# Patient Record
Sex: Female | Born: 1985 | Race: Black or African American | Hispanic: No | Marital: Single | Smoking: Current every day smoker
Health system: Southern US, Community
[De-identification: ages and names within clinical notes are randomized; demographics above are authoritative.]

## PROBLEM LIST (undated history)

## (undated) DIAGNOSIS — E119 Type 2 diabetes mellitus without complications: Secondary | ICD-10-CM

---
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
## 2005-01-03 ENCOUNTER — Ambulatory Visit (HOSPITAL_COMMUNITY): Admission: AD | Admit: 2005-01-03 | Discharge: 2005-01-04 | Payer: Self-pay | Admitting: Obstetrics and Gynecology

## 2005-01-04 ENCOUNTER — Inpatient Hospital Stay (HOSPITAL_COMMUNITY): Admission: AD | Admit: 2005-01-04 | Discharge: 2005-01-07 | Payer: Self-pay | Admitting: Obstetrics and Gynecology

## 2008-10-03 ENCOUNTER — Other Ambulatory Visit: Admission: RE | Admit: 2008-10-03 | Discharge: 2008-10-03 | Payer: Self-pay | Admitting: Obstetrics and Gynecology

## 2008-11-14 ENCOUNTER — Inpatient Hospital Stay (HOSPITAL_COMMUNITY): Admission: AD | Admit: 2008-11-14 | Discharge: 2008-11-20 | Payer: Self-pay | Admitting: Obstetrics and Gynecology

## 2008-11-14 ENCOUNTER — Ambulatory Visit (HOSPITAL_COMMUNITY): Admission: RE | Admit: 2008-11-14 | Discharge: 2008-11-14 | Payer: Self-pay | Admitting: Obstetrics and Gynecology

## 2008-11-16 ENCOUNTER — Encounter: Payer: Self-pay | Admitting: Otolaryngology

## 2009-04-20 ENCOUNTER — Inpatient Hospital Stay (HOSPITAL_COMMUNITY): Admission: AD | Admit: 2009-04-20 | Discharge: 2009-04-23 | Payer: Self-pay | Admitting: Obstetrics and Gynecology

## 2009-04-20 ENCOUNTER — Ambulatory Visit: Payer: Self-pay | Admitting: Obstetrics & Gynecology

## 2009-09-29 ENCOUNTER — Emergency Department (HOSPITAL_COMMUNITY): Admission: EM | Admit: 2009-09-29 | Discharge: 2009-09-29 | Payer: Self-pay | Admitting: Emergency Medicine

## 2009-11-28 ENCOUNTER — Emergency Department (HOSPITAL_COMMUNITY): Admission: EM | Admit: 2009-11-28 | Discharge: 2009-11-28 | Payer: Self-pay | Admitting: Emergency Medicine

## 2010-03-25 ENCOUNTER — Emergency Department (HOSPITAL_COMMUNITY): Admission: EM | Admit: 2010-03-25 | Discharge: 2010-03-25 | Payer: Self-pay | Admitting: Emergency Medicine

## 2010-11-16 ENCOUNTER — Encounter: Payer: Self-pay | Admitting: Family Medicine

## 2011-01-14 LAB — POCT I-STAT, CHEM 8
BUN: 3 mg/dL — ABNORMAL LOW (ref 6–23)
Calcium, Ion: 1.15 mmol/L (ref 1.12–1.32)
Chloride: 107 mEq/L (ref 96–112)
Creatinine, Ser: 0.6 mg/dL (ref 0.4–1.2)
Glucose, Bld: 98 mg/dL (ref 70–99)
HCT: 41 % (ref 36.0–46.0)
Hemoglobin: 13.9 g/dL (ref 12.0–15.0)
Potassium: 4.1 mEq/L (ref 3.5–5.1)
Sodium: 141 mEq/L (ref 135–145)
TCO2: 25 mmol/L (ref 0–100)

## 2011-01-14 LAB — PREGNANCY, URINE: Preg Test, Ur: NEGATIVE

## 2011-01-14 LAB — GLUCOSE, CAPILLARY: Glucose-Capillary: 91 mg/dL (ref 70–99)

## 2011-02-02 LAB — CBC
HCT: 33.7 % — ABNORMAL LOW (ref 36.0–46.0)
HCT: 39.2 % (ref 36.0–46.0)
Hemoglobin: 11.6 g/dL — ABNORMAL LOW (ref 12.0–15.0)
Hemoglobin: 13.4 g/dL (ref 12.0–15.0)
MCHC: 34.3 g/dL (ref 30.0–36.0)
MCHC: 34.5 g/dL (ref 30.0–36.0)
MCV: 86.3 fL (ref 78.0–100.0)
MCV: 86.8 fL (ref 78.0–100.0)
Platelets: 302 10*3/uL (ref 150–400)
Platelets: 374 10*3/uL (ref 150–400)
RBC: 3.88 MIL/uL (ref 3.87–5.11)
RBC: 4.54 MIL/uL (ref 3.87–5.11)
RDW: 13.9 % (ref 11.5–15.5)
RDW: 14.1 % (ref 11.5–15.5)
WBC: 13.1 10*3/uL — ABNORMAL HIGH (ref 4.0–10.5)
WBC: 15.6 10*3/uL — ABNORMAL HIGH (ref 4.0–10.5)

## 2011-02-02 LAB — RPR: RPR Ser Ql: NONREACTIVE

## 2011-02-09 LAB — DIFFERENTIAL
Basophils Absolute: 0.1 10*3/uL (ref 0.0–0.1)
Basophils Absolute: 0 10*3/uL (ref 0.0–0.1)
Basophils Absolute: 0 10*3/uL (ref 0.0–0.1)
Basophils Absolute: 0 10*3/uL (ref 0.0–0.1)
Basophils Absolute: 0 10*3/uL (ref 0.0–0.1)
Basophils Absolute: 0.1 10*3/uL (ref 0.0–0.1)
Basophils Relative: 0 % (ref 0–1)
Basophils Relative: 0 % (ref 0–1)
Basophils Relative: 0 % (ref 0–1)
Basophils Relative: 0 % (ref 0–1)
Basophils Relative: 0 % (ref 0–1)
Basophils Relative: 1 % (ref 0–1)
Eosinophils Absolute: 0.1 10*3/uL (ref 0.0–0.7)
Eosinophils Absolute: 0.1 10*3/uL (ref 0.0–0.7)
Eosinophils Absolute: 0.3 10*3/uL (ref 0.0–0.7)
Eosinophils Absolute: 0.3 10*3/uL (ref 0.0–0.7)
Eosinophils Absolute: 0.4 10*3/uL (ref 0.0–0.7)
Eosinophils Absolute: 0.4 10*3/uL (ref 0.0–0.7)
Eosinophils Relative: 0 % (ref 0–5)
Eosinophils Relative: 1 % (ref 0–5)
Eosinophils Relative: 2 % (ref 0–5)
Eosinophils Relative: 3 % (ref 0–5)
Eosinophils Relative: 3 % (ref 0–5)
Eosinophils Relative: 3 % (ref 0–5)
Lymphocytes Relative: 10 % — ABNORMAL LOW (ref 12–46)
Lymphocytes Relative: 25 % (ref 12–46)
Lymphocytes Relative: 27 % (ref 12–46)
Lymphocytes Relative: 27 % (ref 12–46)
Lymphocytes Relative: 29 % (ref 12–46)
Lymphocytes Relative: 31 % (ref 12–46)
Lymphs Abs: 2.3 10*3/uL (ref 0.7–4.0)
Lymphs Abs: 2.9 10*3/uL (ref 0.7–4.0)
Lymphs Abs: 3 10*3/uL (ref 0.7–4.0)
Lymphs Abs: 3.9 10*3/uL (ref 0.7–4.0)
Lymphs Abs: 3.9 10*3/uL (ref 0.7–4.0)
Lymphs Abs: 4 10*3/uL (ref 0.7–4.0)
Monocytes Absolute: 1.4 10*3/uL — ABNORMAL HIGH (ref 0.1–1.0)
Monocytes Absolute: 0.8 10*3/uL (ref 0.1–1.0)
Monocytes Absolute: 0.9 10*3/uL (ref 0.1–1.0)
Monocytes Absolute: 0.9 10*3/uL (ref 0.1–1.0)
Monocytes Absolute: 1 10*3/uL (ref 0.1–1.0)
Monocytes Absolute: 1.1 10*3/uL — ABNORMAL HIGH (ref 0.1–1.0)
Monocytes Relative: 7 % (ref 3–12)
Monocytes Relative: 7 % (ref 3–12)
Monocytes Relative: 7 % (ref 3–12)
Monocytes Relative: 7 % (ref 3–12)
Monocytes Relative: 7 % (ref 3–12)
Monocytes Relative: 8 % (ref 3–12)
Neutro Abs: 18 10*3/uL — ABNORMAL HIGH (ref 1.7–7.7)
Neutro Abs: 6.8 10*3/uL (ref 1.7–7.7)
Neutro Abs: 7.1 10*3/uL (ref 1.7–7.7)
Neutro Abs: 8 10*3/uL — ABNORMAL HIGH (ref 1.7–7.7)
Neutro Abs: 8.4 10*3/uL — ABNORMAL HIGH (ref 1.7–7.7)
Neutro Abs: 9.5 10*3/uL — ABNORMAL HIGH (ref 1.7–7.7)
Neutrophils Relative %: 83 % — ABNORMAL HIGH (ref 43–77)
Neutrophils Relative %: 58 % (ref 43–77)
Neutrophils Relative %: 61 % (ref 43–77)
Neutrophils Relative %: 63 % (ref 43–77)
Neutrophils Relative %: 64 % (ref 43–77)
Neutrophils Relative %: 65 % (ref 43–77)

## 2011-02-09 LAB — CBC
HCT: 36.7 % (ref 36.0–46.0)
HCT: 32.9 % — ABNORMAL LOW (ref 36.0–46.0)
HCT: 33.3 % — ABNORMAL LOW (ref 36.0–46.0)
HCT: 33.5 % — ABNORMAL LOW (ref 36.0–46.0)
HCT: 34.3 % — ABNORMAL LOW (ref 36.0–46.0)
HCT: 35.3 % — ABNORMAL LOW (ref 36.0–46.0)
Hemoglobin: 12.4 g/dL (ref 12.0–15.0)
Hemoglobin: 11 g/dL — ABNORMAL LOW (ref 12.0–15.0)
Hemoglobin: 11.2 g/dL — ABNORMAL LOW (ref 12.0–15.0)
Hemoglobin: 11.5 g/dL — ABNORMAL LOW (ref 12.0–15.0)
Hemoglobin: 11.6 g/dL — ABNORMAL LOW (ref 12.0–15.0)
Hemoglobin: 11.7 g/dL — ABNORMAL LOW (ref 12.0–15.0)
MCHC: 33.8 g/dL (ref 30.0–36.0)
MCHC: 32.8 g/dL (ref 30.0–36.0)
MCHC: 33.5 g/dL (ref 30.0–36.0)
MCHC: 33.6 g/dL (ref 30.0–36.0)
MCHC: 34.1 g/dL (ref 30.0–36.0)
MCHC: 34.3 g/dL (ref 30.0–36.0)
MCV: 86.1 fL (ref 78.0–100.0)
MCV: 85 fL (ref 78.0–100.0)
MCV: 85.6 fL (ref 78.0–100.0)
MCV: 86.3 fL (ref 78.0–100.0)
MCV: 86.5 fL (ref 78.0–100.0)
MCV: 87 fL (ref 78.0–100.0)
Platelets: 400 10*3/uL (ref 150–400)
Platelets: 373 10*3/uL (ref 150–400)
Platelets: 388 10*3/uL (ref 150–400)
Platelets: 411 10*3/uL — ABNORMAL HIGH (ref 150–400)
Platelets: 418 10*3/uL — ABNORMAL HIGH (ref 150–400)
Platelets: 425 10*3/uL — ABNORMAL HIGH (ref 150–400)
RBC: 4.26 MIL/uL (ref 3.87–5.11)
RBC: 3.81 MIL/uL — ABNORMAL LOW (ref 3.87–5.11)
RBC: 3.85 MIL/uL — ABNORMAL LOW (ref 3.87–5.11)
RBC: 3.94 MIL/uL (ref 3.87–5.11)
RBC: 4 MIL/uL (ref 3.87–5.11)
RBC: 4.06 MIL/uL (ref 3.87–5.11)
RDW: 13.2 % (ref 11.5–15.5)
RDW: 12.9 % (ref 11.5–15.5)
RDW: 13 % (ref 11.5–15.5)
RDW: 13 % (ref 11.5–15.5)
RDW: 13.3 % (ref 11.5–15.5)
RDW: 13.4 % (ref 11.5–15.5)
WBC: 21.8 10*3/uL — ABNORMAL HIGH (ref 4.0–10.5)
WBC: 10.9 10*3/uL — ABNORMAL HIGH (ref 4.0–10.5)
WBC: 12.2 10*3/uL — ABNORMAL HIGH (ref 4.0–10.5)
WBC: 12.3 10*3/uL — ABNORMAL HIGH (ref 4.0–10.5)
WBC: 13.7 10*3/uL — ABNORMAL HIGH (ref 4.0–10.5)
WBC: 14.9 10*3/uL — ABNORMAL HIGH (ref 4.0–10.5)

## 2011-02-09 LAB — BASIC METABOLIC PANEL
BUN: 2 mg/dL — ABNORMAL LOW (ref 6–23)
BUN: 2 mg/dL — ABNORMAL LOW (ref 6–23)
CO2: 23 mEq/L (ref 19–32)
CO2: 23 mEq/L (ref 19–32)
Calcium: 8.1 mg/dL — ABNORMAL LOW (ref 8.4–10.5)
Calcium: 8.6 mg/dL (ref 8.4–10.5)
Chloride: 101 mEq/L (ref 96–112)
Chloride: 102 mEq/L (ref 96–112)
Creatinine, Ser: 0.4 mg/dL (ref 0.4–1.2)
Creatinine, Ser: 0.42 mg/dL (ref 0.4–1.2)
GFR calc Af Amer: >60 mL/min (ref >60)
GFR calc Af Amer: >60 mL/min (ref >60)
GFR calc non Af Amer: >60 mL/min (ref >60)
GFR calc non Af Amer: >60 mL/min (ref >60)
Glucose, Bld: 90 mg/dL (ref 70–99)
Glucose, Bld: 94 mg/dL (ref 70–99)
Potassium: 3.4 mEq/L — ABNORMAL LOW (ref 3.5–5.1)
Potassium: 3.8 mEq/L (ref 3.5–5.1)
Sodium: 131 mEq/L — ABNORMAL LOW (ref 135–145)
Sodium: 133 mEq/L — ABNORMAL LOW (ref 135–145)

## 2011-02-09 LAB — EYE CULTURE: Culture: NO GROWTH

## 2011-03-10 NOTE — H&P (Signed)
Michaela Sanders, Michaela Sanders NO.:  [id number]   MEDICAL RECORD NO.:  [id number]          PATIENT TYPE:  INP   LOCATION:  5148                         FACILITY:  [facility]   PHYSICIAN:  Antony Contras, MD     DATE OF BIRTH:  Dec 18, 1985   DATE OF ADMISSION:  11/15/2008  DATE OF DISCHARGE:                              HISTORY & PHYSICAL   CHIEF COMPLAINT:  Facial swelling.   HISTORY OF PRESENT ILLNESS:  The patient is a 25 year old female who is  59 weeks' pregnant who developed sinus and nasal congestion several days  ago with headache and bloody nasal drainage.  She was treated as an  outpatient with a Z-Pak and decongestant.  Yesterday morning, she  developed left upper and lower eyelid edema and went to see her OB/GYN.  A CT scan was performed and she was admitted to the hospital and placed  on intravenous antibiotics, Afrin, an antibiotic eyedrops.  Swelling and  pain has worsened, however.  Swelling has spread down into her cheek as  well.  Pain is now 5/10 after some pain medication.  She has had no  previous significant sinus disease.   PAST MEDICAL HISTORY:  Pregnancy.   PAST SURGICAL HISTORY:  Ear tubes and adenotonsillectomy.   MEDICATIONS:  Z-Pak and Zofran.   ALLERGIES:  PENICILLIN from childhood.   FAMILY HISTORY:  Diabetes.   SOCIAL HISTORY:  The patient has been a smoker, but quit 2 months ago.  She drinks alcohol occasionally though does not drink during pregnancy.  She is unemployed.  She lives with her 80-year-old child.   REVIEW OF SYSTEMS:  Negative except as listed above.   PHYSICAL EXAMINATION:  VITAL SIGNS:  Afebrile.  Vital signs stable.  GENERAL:  The patient is in no acute distress and is pleasant and  cooperative.  EARS:  External external ears are normal.  External canals are clear.  Tympanic membranes are intact but have some scarring.  Middle ear spaces  are aerated.  NOSE:  External nose is normal.  There is a piercing of the nose.   The  nasal passages are congested.  OROPHARYNX:  There is a tongue piercing.  The lips, teeth, and gums are  normal.  The oropharynx is normal.  Floor of mouth and buccal mucosa are  normal.  EYES:  The left upper and lower eyelids are edematous and closed.  They  are also mildly reddened.  The eyes able to be opened manually.  Extraocular movements are intact except for upward gaze with the left  eye, which is diminished.  Pupils are equal, round, and reactive to  light.  Visual acuity is grossly normal.  FACE:  The edema that involves the eyelids extends into the left cheek  and left temporal region, which is moderately tender and also reddened.  NECK:  Nontender without mass or deformity.  LYMPHATICS:  There are no enlarged cervical lymph nodes.  THYROID:  Normal to palpation.  SALIVARY GLANDS:  Normal to palpation.  CRANIAL NERVES:  II-XII grossly intact.   RADIOLOGIC EXAM:  CT scan  of the sinuses performed yesterday was  personally reviewed.  This demonstrates complete opacification of the  left maxillary sinus with new opacification of the right side with an  air-fluid level.  The anterior ethmoid on the left side is opacified  with some scattered opacification in the right anterior ethmoid.  There  is mild mucoperiosteal thickening of the right frontal sinus, but no  left frontal sinus.  Sphenoid sinuses are normal.  There is edema  involving the left cheek and eyelid soft tissues that appears to be  preseptal.  The orbital structures are normal.   ASSESSMENT:  The patient is a 25 year old female who is 68 weeks'  pregnant and has acute maxillary ethmoid sinusitis with associated  preseptal cellulitis on the left side.   PLAN:  The patient is now being admitted to [facility] and we will  continue on imipenem IV.  She will also continue having Afrin three  times daily in the nose as well as Blephamide eye drops as prescribed  previously.  Pain medication will consist of  Percocet and IV medicine as  needed.  It is not uncommon for there to be a delay in clinical  improvement after starting intravenous antibiotics.  She just started  them yesterday.  If the process progresses, however, an additional  imaging test may be considered.  Surgical management would be reserved  for disease that is failing antibiotic therapy.  Her unborn child would  be at risk in that case.  Also, an ophthalmology consultation will be  requested in the morning.      Antony Contras, MD  Electronically Signed     DDB/MEDQ  D:  11/15/2008  T:  11/16/2008  Job:  [id number]

## 2011-03-10 NOTE — H&P (Signed)
NAME:  Michaela Sanders, Michaela Sanders NO.:  [id number]   MEDICAL RECORD NO.:  [id number]          PATIENT TYPE:  INP   LOCATION:  A321                          FACILITY:  [facility]   PHYSICIAN:  Tilda Burrow, M.D. DATE OF BIRTH:  Dec 11, 1985   DATE OF ADMISSION:  11/14/2008  DATE OF DISCHARGE:  [facility]                              HISTORY & PHYSICAL   ADMITTING DIAGNOSIS:  Left periorbital cellulitis without evidence of  abscess.   HISTORY OF PRESENT ILLNESS:  This is a 25 year old female gravida 2,  para 1-0-0-1, now 17 weeks 3 days gestation was seen in the office today  2 days after being seen for upper respiratory symptoms and initiated on  Z-Pak.  She returns today with swelling of left eye and left side of  face, and he had with teeth, ear, and eye hurting with watery mattery  discharge since when she woke up this morning and teary due to the  discomfort.  She is alert and oriented, appropriate with cranial nerves  intact.  Rest of the physical exam shows the periorbital tissues red and  swollen.  The conjunctiva is not injected on for either eye.  She is not  sensitive to light.  She has normal vision, but does have to hold left  eye open in order to see fully with that eye.  Rest of the HEENT shows  that both eardrums show evidence of chronic scarring at the site of  prior grommets.  She has had a history of bilateral grommets at age 6 as  well as tonsillectomy at age 43.  She has cervical lymph node tenderness  along the sternocleidomastoid muscles.  Neck is supple and she has full  range of motion in the neck.  Chest is clear to auscultation.  Nose is  running clear mucus.  Pharynx shows the evidence of prior tonsillectomy  and minimal redness in the throat.  There is no suspicion of abscess  around the teeth.  So far today, she has had a CT which confirms that  there is left orbital and periorbital cellulitis without the evidence of  abscess and there is mild exophthalmos.   She has pansinusitis with no  bony destruction suspect noted.  Case has been discussed with Dr. Flo Shanks at [facility].  Phone number [phone]. Due to the patient's  inability to coordinate transportation to [place], we will admit her  here and begin care with phone consultation with Dr. Lazarus Salines as  necessary and transfer on Friday if not dramatically improved.  Infectious Disease has been consulted by Dr. Lazarus Salines and recommends  imipenem as broad-spectrum coverage.  Additional orders are in chart  including Afrin nasal spray and Blephamide.   PAST MEDICAL HISTORY:  Benign.   SURGICAL HISTORY:  Tonsillectomy and grommets at age 23.   ALLERGIES:  PENICILLIN which caused a rash as a child.   MEDICATIONS:  Prenatal vitamins.   SOCIAL HISTORY:  She is single, lives with her baby as well.  She is a  [employer] trader.   FAMILY HISTORY:  Positive for hypertension and diabetes.  PHYSICAL EXAMINATION:  GENERAL:  Remainder of physical exam shows a  morbidly obese Caucasian female in obvious discomfort who is more  comfortable resting face down on the bed or on Left side.  VITAL SIGNS:  Weight 293.5.  Blood pressure 120/90, temperature 98.3,  pulse 90. See hpi for rest of physical.   Urinalysis positive for protein and negative for leukocyte esterase, for  nitrites.  There is no bleeding.  The pregnancy was normal.  The fetal  heart rate in the 150s.  On abdominal exam, there was no uterine  tenderness.  Prenatal course today has been notable for blood type O  positive.  Rubella immune present.  Hemoglobin 14, hematocrit 43,  platelets 387,000.  Hepatitis, HIV, RPR, GC, and Chlamydia all negative.  We will admit IV antibiotics, expect dramatic response or we will  consider transfer if not significantly better in 36 hours.   Over 90 minutes spent in consultation, coordination of care, and  admission processes.      Tilda Burrow, M.D.  Electronically Signed     JVF/MEDQ   D:  11/14/2008  T:  11/15/2008  Job:  [id number]

## 2011-03-13 NOTE — Op Note (Signed)
NAME:  Michaela Sanders, Michaela Sanders NO.:  [id number]   MEDICAL RECORD NO.:  [id number]          PATIENT TYPE:  INP   LOCATION:  A416                          FACILITY:  [facility]   PHYSICIAN:  Langley Gauss, MD     DATE OF BIRTH:  [birth year]   DATE OF PROCEDURE:  01/04/2005  DATE OF DISCHARGE:                                 OPERATIVE REPORT   DIAGNOSES:  1.  A 39+ week intrauterine pregnancy in labor.  2.  Positive GBS carrier status.  3.  Morbid obesity.   PROCEDURES PERFORMED:  January 04, 2005 placement of continuous lumbar  epidural analgesia by Dr. Roylene Reason. Lisette Grinder.   SUMMARY:  The patient had actually presented for evaluation the p.m. prior,  presenting January 03, 2005 and discharged home on January 04, 2005 early a.m.  following observation where she was determined not to be in labor. She  represented on January 04, 2005 at which time she states that the contractions  had markedly increased in their frequency, intensity and strength. Serial  examinations again were performed which did document cervical change such  that when I examined her she was noted be 3+ cm dilated, 100%  _________________  fetal scalp electrode was placed with resultant amniotomy  and findings of clear amniotic fluid. The patient initially requested Nubain  and Phenergan for pain relief; however, immediately following the first dose  she was noted to have a prolonged deceleration about 60 to 80 beats per  minute for 7 minutes duration with a return to the baseline. Fetal heart  rate subsequently remained reassuring with no further decelerations noted.  After the patient determined that the IV analgesic was inadequate for relief  of labor, the patient requested epidural. Epidural placement was very  difficult secondary to the patient's morbid obesity and the nonpalpable bony  landmarks. Epidural was placed. Immediately following epidural, the patient  is examined and noted be 5 cm dilated, 0 station and  completely effaced.  Intrauterine pressure catheter had been placed just prior to the epidural to  document the adequacy and frequency of the uterine contractions.  Subsequently, the patient spontaneously labored. She did not require any  Pitocin augmentation. Upon reaching a transitional phase of labor at 8 cm  dilatation, the patient began having changes in the fetal heart rate with  increased variability as well as during each uterine contraction. She was  noted to have slowing of the heart rate with a drop in the baseline to about  80 beats per minute. Thi would persist for about 45 seconds during the  contractions, variable type deceleration with a slow return to the baseline;  however, serial examinations at this point revealed the patient was  progressing very rapidly. There was anterior lip of cervix only, and the  vertex was noted to be in a LOT position. As the patient was placed in the  stirrups and encouraged to push, Foley catheter was removed as well as the  intrauterine pressure catheter. The patient was observed pushing very well,  and during these pushing efforts, there was noted to be  a spontaneous  rotation of the vertex occurring to a LOA position as it passed beneath the  pubic symphysis. Subsequently with the presenting fetal scalp visible at the  introitus without separating the labia, a Kiwi vacuum extractor was called  for. This was placed on the infant's vertex. Over the next two contractions  with gentle pulling efforts with each contraction at all times with the  vacuum pressure kept within the safe green range, there was very easy  descent to delivery in an OA position. With distension of the perineum, a  small midline episiotomy is performed. Infant did deliver in a direct OA  position over the small midline episiotomy. Mouth and nares bulb suctioned  of clear amniotic fluid. A nuchal cord x1 is reduced prior to delivery the  shoulders. This nuchal cord had  been responsible for a variable type  decelerations during the second stage of labor. Following this, there was a  spontaneous rotation to a right anterior shoulder position. Gentle downward  traction combined with good expulsive efforts resulted in delivery of the  remainder of the infant without difficulty. Umbilical cord was milked toward  the infant, cord was doubly clamped and cut, and infant was placed on  maternal abdomen for immediate bonding purposes. Arterial cord gas and cord  blood are then obtained from the placenta. Gentle traction of the placenta  results in separation which on examination appears to be an intact placenta  with associated three-vessel umbilical cord. Examination of genital tract  revealed some bilateral labial tears with minimal amount of active bleeding.  These are repaired with a 0 chromic in a running locked fashion. Midline  episiotomy subsequently repaired in a single-layer closure of 0 chromic  running lock suture in the vaginal mucosa followed by single running layer  of 0 chromic on the perineal body. The patient tolerated the delivery very  well. She was taken out of dorsal lithotomy position. Epidural catheter is  removed, the blue tip noted to be intact. Mother and infant allowed to bond  at this time following delivery.      DC/MEDQ  D:  01/05/2005  T:  01/05/2005  Job:  [id number]

## 2011-03-13 NOTE — Group Therapy Note (Signed)
NAME:  Michaela Sanders, Michaela Sanders NO.:  [id number]   MEDICAL RECORD NO.:  [id number]          PATIENT TYPE:  OIB   LOCATION:  LDR2                          FACILITY:  [facility]   PHYSICIAN:  Tilda Burrow, M.D. DATE OF BIRTH:  [birth year]   DATE OF PROCEDURE:  10/28/2004  DATE OF DISCHARGE:                                   PROGRESS NOTE   HISTORY OF PRESENT ILLNESS:  Holland is an 25 year old primigravida, who  presented to the office today with lower abdominal cramping and pain, states  that she thought she might be leaking her water.  With sterile speculum exam  at that time, the cervix was noted to be closed, no fluid leaking actively  from the cervix and with cough, there was no leakage of fluid.  Nitrazine  and fern were also negative.  A fetal fibronectin was obtained before exam  was done and sent to the lab for analysis.  She was sent over to labor and  delivery at that time for observation to monitor for contractions and get  some assessing labs.   PHYSICAL EXAMINATION:  VITAL SIGNS:  Stable.  Fetal heart rate pattern is  stable.  There is some irritability present which resolved with some subcu  and p.o. Terbutaline.  Still complains of some back pain, although the  cramping has diminished.  Fetal heart rate pattern is stable in the 120s-  130s  with accelerations noted.   MEDICAL HISTORY:  Negative.   SURGICAL HISTORY:  Positive for tonsils.   ALLERGIES:  PENICILLIN.   MEDICATIONS:  She is on prenatal vitamins.   FAMILY HISTORY:  She is single.  She lives with her mom.   PRENATAL HISTORY:  Blood type is O positive.  UDS is negative.  Rubella is  immune.  Hepatitis B surface antigen negative, HIV negative, Pap is normal.  GC and Chlamydia were negative.  AFP is within normal limits.   PLAN:  We are going to overnight observation, give IV antibiotics, and  probable discharge in the morning.     Darl   DL/MEDQ  D:  60/45/4098  T:  10/28/2004  Job:   [id number]   cc:   [facility]

## 2011-03-13 NOTE — Op Note (Signed)
NAME:  Michaela Sanders, Michaela Sanders NO.:  [id number]   MEDICAL RECORD NO.:  [id number]          PATIENT TYPE:  INP   LOCATION:  A416                          FACILITY:  [facility]   PHYSICIAN:  Langley Gauss, MD     DATE OF BIRTH:  1986-05-21   DATE OF PROCEDURE:  01/04/2005  DATE OF DISCHARGE:                                 OPERATIVE REPORT   PROCEDURE:  Placement of continuous lumbar epidural analgesia at the L2-3  interspace performed by Dr. Lisette Grinder   COMPLICATIONS:  None.   SUMMARY:  Appropriate informed consent was obtained.  The procedure was very  technically difficult secondary to the patient is moderate obesity with  weight of 300-plus pounds.  The bony landmarks in back were minimally  palpable; thus, I was able to perform the procedure only by having the  patient in a seated position and remaining in the midline.  The patient back  is sterilely prepped and draped utilizing an epidural ray.  Five milliliters  of 1% lidocaine plain injected at what is felt to be the midline of the L2-3  interspace to raise a small skin wheal.  The 17-gauge Tuohy-Schliff needle  was then utilized to loss or resistance in an air-filled glass syringe to  attempt to enter the epidural space.  On the first attempt there was  excellent loss resistance.  No signs of CSF or blood obtained; thus, 5 mL  1.5% lidocaine plus epinephrine injected through the epidural needle.  Again  no signs of CSF or intravascular injection obtained.  The catheter was then  inserted; however, I was unable to feed the catheter beyond the tip of the  epidural needle.  This would be consistent with failure to adequately enter  the epidural space; thus, the epidural needle was directed slightly  cephalic.  On this second attempt, likewise loss resistance was obtained but  I was unable to feed the catheter.  The epidural needle was then removed,  again the midline is established.  Initial placement of the needle then  was  slightly caudad and slightly to the left of the previous two placements.  On  the third attempt, there was excellent loss of resistance.  I was able to  feed the epidural catheter to a depth of 4 cm.  The epidural needle was  removed, the aspiration test was negative second.  A second test dose of 2  mL 1.5%  lidocaine plus epinephrine injected through the epidural catheter.  No signs of CSF or intravascular injection obtained; thus, of5 mL 2%  lidocaine plain injected through the epidural catheter.  The catheter was  then secured in place.  Upon return to the bed, the patient is complaining  of warmth and some heaviness in the legs bilaterally.  She thus is connected  to the infusion pump with the standard mixture.  She will be treated with a  continuous infusion rate of 14 mL/hr.  Fetal heart rate continues to remain  reassuring following placement of the epidural.      DC/MEDQ  D:  01/05/2005  T:  01/05/2005  Job:  [id number]

## 2011-03-13 NOTE — Discharge Summary (Signed)
NAMESHARONLEE, NINE NO.:  [id number]   MEDICAL RECORD NO.:  [id number]          PATIENT TYPE:  INP   LOCATION:  5148                         FACILITY:  [facility]   PHYSICIAN:  Zola Button T. Michaela Sanders, M.D. DATE OF BIRTH:  1986-04-05   DATE OF ADMISSION:  11/15/2008  DATE OF DISCHARGE:  11/20/2008                               DISCHARGE SUMMARY   CHIEF COMPLAINT:  Facial swelling.   HISTORY OF PRESENT ILLNESS:  A 25 year old female, [redacted] weeks pregnant,  developed sinus and nasal congestion several days prior to admission  with headache and bloody drainage.  She was treated with a Z-Pak and  some decongestion.  One day prior to admission, she developed left upper  and lower eyelid swelling and went to Dr. Emelda Fear, her OB/GYN, in  [place].  A CT scan was performed which showed pansinusitis and pre  and post septal orbital cellulitis.  She was placed in [facility] and begun on imipenem, Afrin for drainage, and antibiotic eye  drops also.  Over 24 hours time, the swelling and pain has worsened with  some extension down into the premalar and cheek tissues.  At this point  in consultation with ENT, she was transferred down to [facility].   PAST MEDICAL HISTORY:  She is currently pregnant.   PAST SURGICAL HISTORY:  Ear tubes and tonsillectomy.   CURRENT MEDICATIONS:  Z-Pak and Zofran prior to admission.  Imipenem in  the hospital.   She is allergic to PENICILLIN.   FAMILY HISTORY:  Diabetes.   SOCIAL HISTORY:  She quit smoking 2 months ago.  She drinks alcohol, but  not during her pregnancy.  She is unemployed.  She has a 25-year-old  child.   REVIEW OF SYSTEMS:  Positive for morbid obesity.   PHYSICAL EXAMINATION:  This is an obese, uncomfortable-appearing young  adult white female.  She has erythematous swelling of the left  periorbital region such that she cannot open her eye.  This is tender.  There is some mattering of the lashes on the left side  only.  The right  eye appears intact.  She is not warm to touch.  Mental status is acute.  Ears are clear.  She has an external nose piercing, but the passages are  healthy except congested.  No active drainage or polyps.  Oral cavity  reveals a tongue piercing with healthy teeth and normal tissues.  Eye  examination shows intact range of motion of the left eye with manual  opening of the lids, although upward gaze maybe slightly limited and  does involve pain.  Pupils were equal, round, and reactive to light.  Visual acuity grossly normal.  Facial skin reveals erythematous, tender  swelling onto the left malar cheek and approaching the temporal region.  Neck without adenopathy.  Lungs clear to auscultation.  Heart with  regular rate and rhythm and no murmurs.  Abdomen is obese, but soft and  active.   ADMISSION DIAGNOSIS:  Left pansinusitis with pre and post septal orbital  cellulitis and facial cellulitis.   Admission plan for hospitalization  for IV antibiotics, topical  antibiotic eye drops, pain medication, Afrin decongestion, and careful  observation.   HOSPITAL COURSE:  This patient was admitted in the evening.  Following  morning, she had an ophthalmology consultation, and Ophthalmology  continued to follow the patient throughout her hospitalization.  On her  second hospital day, white blood cell count 15,000 down from 22,000, but  still with a moderate left shift.  She continued to have pain and  significant facial swelling.  She and her mother thought the swelling  was perhaps very slightly less.  The culture was sent from the nose to  rule out possible MRSA and vancomycin was added presumptively.  Ophthalmology had an opportunity to complete their evaluation with no  other additional findings.  On the third, fourth and fifth hospital  days, she began to develop less swelling and tenderness of the face, and  Ophthalmology followup continued to show satisfactory progress.   The  cultures showed no growth.  On the fifth hospital day, she was clearly  making progress and was switched to oral Bactrim.  All of her various  drugs were cleared with her gynecologist, and she did have appropriate  fetal monitoring while she was in the hospital.  On the sixth hospital  day, she was discharged to her home in the care of her family.  On this  day, white blood cell count was 12.2 thousand, cultures showed no  growth.  She is scheduled back in my office in a couple of days and with  Dr. Vonna Kotyk of Ophthalmology in 1 week.  She will continue nasal hygiene  measures, elevation of the head, and will contact us for any progressive  symptoms.   FINAL DIAGNOSIS:  1. Left periorbital cellulitis/orbital cellulitis.  2. Acute pansinusitis.  3. Morbid obesity.  4. Pregnancy.   PROCEDURES PERFORMED:  None.   CONDITION AT DISCHARGE:  Ambulatory with pain controlled, cultures  negative, and cellulitis improved.   PRESCRIPTIONS:  Bactrim and Percocet.   Return visit in 2 days for Dr. Lazarus Sanders, 1 week for Dr. Vonna Kotyk.  Instructions written and given.      Michaela Sanders. Michaela Sanders, M.D.  Electronically Signed     KTW/MEDQ  D:  12/26/2008  T:  12/26/2008  Job:  [id number]   cc:   Tilda Burrow, M.D.  Timothy R. Vonna Kotyk, MD

## 2011-03-13 NOTE — H&P (Signed)
NAME:  Michaela Sanders, Michaela Sanders NO.:  [id number]   MEDICAL RECORD NO.:  [id number]          PATIENT TYPE:  INP   LOCATION:  LDR2                          FACILITY:  [facility]   PHYSICIAN:  Langley Gauss, MD     DATE OF BIRTH:  01/05/1986   DATE OF ADMISSION:  01/04/2005  DATE OF DISCHARGE:  LH                                HISTORY & PHYSICAL   This is an 25 year old, gravida 1, para 0, at 39-1/[redacted] weeks gestation who  presents complaining of increased frequency and intensity of uterine  contractions.  She states she has occasionally had some urinary leakage  during this pregnancy.  On her way over here today, she did have one episode  of very limited amount of leakage of fluid which she again felt to be due to  bladder leakage.  Subsequently, she has had no further leakage of fluid  here, and she is noted to be nitrazine negative per the nursing staff.   PRENATAL COURSE:  See dictation.  The patient was previously observed from  March 11 and discharged home on January 04, 2005, when she was noted not to be  in active labor with no significant cervical change.  She now states the  frequent and intensity of uterine contractions have increased to the point  where she is determined to be presenting in labor.   PAST MEDICAL HISTORY:  See previous dictation.  She is GBS positive and will  require treatment during labor.   PHYSICAL EXAMINATION:  VITAL SIGNS:  Blood pressure 135/80, pulse rate 80,  respiratory rate 20.  HEENT:  Negative.  NECK:  No adenopathy.  Neck is supple.  Thyroid is not palpable.  LUNGS:  Clear.  CARDIOVASCULAR:  Regular rate and rhythm.  ABDOMEN:  Soft and nontender.  Fundal height is 42 cm. She is vertex  presentation by Leopold's maneuvers.  No surgical scars are identified.  EXTREMITIES:  Normal.  PELVIC:  Normal external genitalia, very dry; specifically, no leakage of  amniotic fluid is noted. Digital examination likewise reveals no fluid  leakage.   Cervix 3+ cm dilated, 100% effaced with vertex at 0 station.  External fetal monitor reveals reassuring fetal heart rate with  accelerations noted.  Uterine contractions traced poorly due to morbid  obesity, but do seem to be occurring every 3 to 5 minutes.   ASSESSMENT:  1.  A 39+ week intrauterine pregnancy.  2.  Morbidly obese with normal glucose tolerance test.  3.  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   PLAN:  The patient is admitted in active labor at this time.  Will proceed  with amniotomy.  Subsequently, will proceed with augmentation of labor if  clinically indicated.      DC/MEDQ  D:  01/04/2005  T:  01/04/2005  Job:  [id number]

## 2011-03-13 NOTE — Discharge Summary (Signed)
Michaela Sanders, Michaela Sanders NO.:  [id number]   MEDICAL RECORD NO.:  [id number]          PATIENT TYPE:  OBV   LOCATION:  LDR2                          FACILITY:  [facility]   PHYSICIAN:  Langley Gauss, MD     DATE OF BIRTH:  October 03, 1986   DATE OF ADMISSION:  01/03/2005  DATE OF DISCHARGE:  03/12/2006LH                                 DISCHARGE SUMMARY   DIAGNOSES:  1.  A 39-week intrauterine pregnancy.  2.  Abdominal pain.  3.  False labor, active labor ruled out.   HISTORY OF PRESENT ILLNESS:  The patient is an 25 year old, gravida 1, para  0, at 39-1/[redacted] weeks gestation.  [facility] provided the OB-GYN care.  Prenatal course complicated by findings of GBS carrier status noted to be  positive.  She will require treatment during labor in an effort to prevent  vertical transmission to the infant.  The patient is also known to have some  perianal condyloma which has been evaluated by [facility].  AFP triple  screen is within normal limits.  Three-hour GTT was within normal limits  following an abnormal screening test at 141.   PAST MEDICAL HISTORY:  She states allergy to PENICILLIN which gives her some type of childhood  rashes.   Had T&A in 1992.  She does have a history of asthmatic bronchitis and uses  albuterol inhaler on a p.r.n. basis.   SOCIAL HISTORY:  The patient is single, living with her mom.   FAMILY HISTORY:  Pertinent for hypertension and diabetes.  She is a gravida  1, para 0.   PHYSICAL EXAMINATION:  GENERAL:  She is noted to be obese black female.  VITAL SIGNS:  Blood pressure 132/84, pulse 102, respiratory rate 20.  HEENT:  Negative.  NECK:  No adenopathy.  Neck is supple.  Thyroid is not palpable.  LUNGS:  Clear.  CARDIOVASCULAR:  Regular rate and rhythm.  ABDOMEN:  Soft and nontender.  She is morbidly obese.  Vertex presentation  by Leopold's maneuvers.  PELVIC:  Normal external genitalia.  No lesions or ulcerations identified.  No vaginal  bleeding, no leakage of fluid.   External fetal monitor poorly traces any significant activity.  Fetal heart  rate reveals a fetal heart rate baseline of 130 with accelerations noted,  normal long-term variability.   Observation period extended with patient initially arriving on January 03, 2005.  Serial digital examinations revealed no significant cervical change  with final examination being 2 to 3 cm dilated, 60% effaced, vertex, -1  station.   Close observation March 11 to March 12.  Discharge services on March 12.  The patient was given 10 mg p.o. Ambien.  Signs and symptoms of labor,  spontaneous rupture of membranes reviewed with patient.  She will return  visit if active labor ensues, otherwise subsequently continue prenatal care  with [facility].      DC/MEDQ  D:  01/04/2005  T:  01/04/2005  Job:  [id number]

## 2011-05-25 ENCOUNTER — Other Ambulatory Visit: Payer: Self-pay | Admitting: Adult Health

## 2011-05-25 ENCOUNTER — Other Ambulatory Visit (HOSPITAL_COMMUNITY)
Admission: RE | Admit: 2011-05-25 | Discharge: 2011-05-25 | Disposition: A | Discharge status: Home / Self Care | Payer: Self-pay | Source: Ambulatory Visit | Attending: Obstetrics and Gynecology | Admitting: Obstetrics and Gynecology

## 2011-05-25 DIAGNOSIS — Z113 Encounter for screening for infections with a predominantly sexual mode of transmission: Secondary | ICD-10-CM | POA: Insufficient documentation

## 2011-05-25 DIAGNOSIS — Z124 Encounter for screening for malignant neoplasm of cervix: Secondary | ICD-10-CM | POA: Insufficient documentation

## 2012-03-14 ENCOUNTER — Encounter (HOSPITAL_COMMUNITY): Payer: Self-pay

## 2012-03-14 ENCOUNTER — Emergency Department (HOSPITAL_COMMUNITY)
Admission: EM | Admit: 2012-03-14 | Discharge: 2012-03-14 | Disposition: A | Discharge status: Home / Self Care | Payer: Worker's Compensation | Attending: Emergency Medicine | Admitting: Emergency Medicine

## 2012-03-14 ENCOUNTER — Ambulatory Visit (HOSPITAL_COMMUNITY): Payer: Worker's Compensation | Attending: Emergency Medicine

## 2012-03-14 DIAGNOSIS — X58XXXA Exposure to other specified factors, initial encounter: Secondary | ICD-10-CM | POA: Insufficient documentation

## 2012-03-14 DIAGNOSIS — M25579 Pain in unspecified ankle and joints of unspecified foot: Secondary | ICD-10-CM | POA: Insufficient documentation

## 2012-03-14 DIAGNOSIS — IMO0002 Reserved for concepts with insufficient information to code with codable children: Secondary | ICD-10-CM | POA: Insufficient documentation

## 2012-03-14 DIAGNOSIS — M25476 Effusion, unspecified foot: Secondary | ICD-10-CM | POA: Insufficient documentation

## 2012-03-14 DIAGNOSIS — S99929A Unspecified injury of unspecified foot, initial encounter: Secondary | ICD-10-CM | POA: Insufficient documentation

## 2012-03-14 DIAGNOSIS — Y990 Civilian activity done for income or pay: Secondary | ICD-10-CM | POA: Insufficient documentation

## 2012-03-14 DIAGNOSIS — Y9210 Unspecified residential institution as the place of occurrence of the external cause: Secondary | ICD-10-CM | POA: Insufficient documentation

## 2012-03-14 DIAGNOSIS — M7989 Other specified soft tissue disorders: Secondary | ICD-10-CM | POA: Insufficient documentation

## 2012-03-14 DIAGNOSIS — F17200 Nicotine dependence, unspecified, uncomplicated: Secondary | ICD-10-CM | POA: Insufficient documentation

## 2012-03-14 DIAGNOSIS — S93409A Sprain of unspecified ligament of unspecified ankle, initial encounter: Secondary | ICD-10-CM | POA: Insufficient documentation

## 2012-03-14 DIAGNOSIS — M25473 Effusion, unspecified ankle: Secondary | ICD-10-CM | POA: Insufficient documentation

## 2012-03-14 DIAGNOSIS — S8990XA Unspecified injury of unspecified lower leg, initial encounter: Secondary | ICD-10-CM | POA: Insufficient documentation

## 2012-03-14 MED ORDER — HYDROCODONE-ACETAMINOPHEN 5-325 MG PO TABS: 1.0000 | ORAL_TABLET | ORAL | Status: AC | PRN

## 2012-03-14 MED ORDER — IBUPROFEN 800 MG PO TABS
800.0000 mg | ORAL_TABLET | Freq: Once | ORAL | Status: AC
  Administered 2012-03-14: 800 mg via ORAL
  Filled 2012-03-14: qty 1

## 2012-03-14 MED ORDER — IBUPROFEN 800 MG PO TABS: 800.0000 mg | ORAL_TABLET | Freq: Three times a day (TID) | ORAL | Status: AC

## 2012-03-14 MED ORDER — HYDROCODONE-ACETAMINOPHEN 5-325 MG PO TABS
1.0000 | ORAL_TABLET | Freq: Once | ORAL | Status: AC
  Administered 2012-03-14: 1 via ORAL
  Filled 2012-03-14: qty 1

## 2012-03-14 NOTE — ED Notes (Signed)
Pt tripped over call bell cord at [employer],  Twisted left ankle

## 2012-03-14 NOTE — Discharge Instructions (Signed)
You have a sprained ankle. Elevate it as much as possible. Ice for the swelling. Light duty at work with limited walking and standing. No lifting of over 10 pounds for the next two days. Use the ibuprofen and stronger pain medicine as needed. Wear the splint for comfort.   Ankle Sprain An ankle sprain happens when the bands of tissue that hold the ankle bones together (ligaments) stretch too much and tear.  HOME CARE   Put ice on the ankle for 15 to 20 minutes, 3 to 4 times a day.   Put ice in a plastic bag.   Place a towel between your skin and the bag.   You may stop icing when the puffiness (swelling) goes down.   Raise (elevate) the injured ankle to lessen puffiness.   Use crutches if your doctor tells you to. Use them until you can walk without pain.   If a plaster splint was applied:   Rest the plaster splint on nothing harder than a pillow for 24 hours.   Do not put weight on it.   Do not get it wet.   Take it off to shower or bathe.   Follow up with your doctor.   Use an elastic wrap for support. Take the wrap off if the toes lose feeling (numb), tingle, or turn cold or blue.   If an air splint was applied:   Add or release air to make it comfortable.   Take it off at night and to shower and bathe.   Wiggle your toes while wearing the air splint.   Only take medicine as told by your doctor.   Do not drive until your doctor says it is okay.  GET HELP RIGHT AWAY IF:   You have more bruising, puffiness, or pain.   Your toes feel cold.   Your medicine does not help lessen your pain.   You are losing feeling in your toes or they turn blue.   You have severe pain.  MAKE SURE YOU:   Understand these instructions.   Will watch your condition.   Will get help right away if you are not doing well or get worse.  Document Released: 03/30/2008 Document Revised: 10/01/2011 Document Reviewed: 03/30/2008 [facility] Patient Information 2012 [place], [place].

## 2012-03-14 NOTE — ED Provider Notes (Signed)
History     CSN: [id number]  Arrival date & time 03/14/12  4098   First MD Initiated Contact with Patient 03/14/12 [phone]      Chief Complaint  Patient presents with  . Ankle Injury    (Consider location/radiation/quality/duration/timing/severity/associated sxs/prior treatment) HPI Michaela Sanders is a 26 y.o. 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 She inverted her foot and now has swelling and tenderness to the lateral left ankle.Bearing weight makes it hurt worse. She has taken no medicines.   History reviewed. No pertinent past medical history.  History reviewed. No pertinent past surgical history.  No family history on file.  History  Substance Use Topics  . Smoking status: Current Everyday Smoker  . Smokeless tobacco: Not on file  . Alcohol Use: No    OB History    Grav Para Term Preterm Abortions TAB SAB Ect Mult Living                  Review of Systems  Constitutional: Negative for fever.       10 Systems reviewed and are negative for acute change except as noted in the HPI.  HENT: Negative for congestion.   Eyes: Negative for discharge and redness.  Respiratory: Negative for cough and shortness of breath.   Cardiovascular: Negative for chest pain.  Gastrointestinal: Negative for vomiting and abdominal pain.  Musculoskeletal: Negative for back pain.       Left ankle pain  Skin: Negative for rash.  Neurological: Negative for syncope, numbness and headaches.  Psychiatric/Behavioral:       No behavior change.    Allergies  Penicillins  Home Medications  No current outpatient prescriptions on file.  BP 122/71  Pulse 89  Temp(Src) 98.1 F (36.7 C) (Oral)  Resp 20  Ht 5\' 3"  (1.6 m)  Wt 265 lb (120.203 kg)  BMI 46.94 kg/m2  SpO2 99%  LMP 03/11/2012  Physical Exam  Nursing note and vitals  reviewed. Constitutional:       Awake, alert, nontoxic appearance.  HENT:  Head: Atraumatic.  Eyes: Right eye exhibits no discharge. Left eye exhibits no discharge.  Neck: Neck supple.  Pulmonary/Chest: Effort normal. She exhibits no tenderness.  Abdominal: Soft. There is no tenderness. There is no rebound.  Musculoskeletal: She exhibits no tenderness.       Baseline ROM except to left ankle which is limited due to pain, no obvious new focal weakness. Swelling to lateral left ankle.Tenderness with palpation.  Neurological:       Mental status and motor strength appears baseline for patient and situation.  Skin: No rash noted.  Psychiatric: She has a normal mood and affect.    ED Course  Procedures (including critical care time)  Labs Reviewed - No data to display Dg Ankle Complete Left  03/14/2012  *RADIOLOGY REPORT*  Clinical Data: Left ankle injury, swelling.  LEFT ANKLE COMPLETE - 3+ VIEW  Comparison: 09/29/2009  Findings: Lateral soft tissue swelling.  Tiny calcific density projects medial to the medial malleolus may be artifact or perhaps sequelae of prior injury, only appreciated on one-view and does not correspond to the area of pain.  Otherwise, no acute fracture or dislocation identified.  No aggressive osseous lesion.  IMPRESSION: Mild lateral soft tissue swelling.  No definite acute osseous abnormality identified.If clinical concern for a fracture persists,  recommend a repeat radiograph in 7-10 days to evaluate for interval change or callus formation.  Original Report Authenticated By: Waneta Martins, M.D.        MDM  Patient who twisted her ankle sustained an ankle sprain. Given anti-inflammatory, analgesics, placed in an ASO splint. X-ray showing mild lateral soft tissue swelling with no bony abnormality.Pt stable in ED with no significant deterioration in condition.The patient appears reasonably screened and/or stabilized for discharge and I doubt any other medical  condition or other [facility] requiring further screening, evaluation, or treatment in the ED at this time prior to discharge.  MDM Reviewed: nursing note and vitals Interpretation: x-ray           Nicoletta Dress. Colon Branch, MD 03/14/12 [phone]

## 2014-03-22 ENCOUNTER — Encounter (HOSPITAL_COMMUNITY): Payer: Self-pay | Admitting: Emergency Medicine

## 2014-03-22 ENCOUNTER — Emergency Department (HOSPITAL_COMMUNITY)
Admission: EM | Admit: 2014-03-22 | Discharge: 2014-03-22 | Disposition: A | Discharge status: Home / Self Care | Payer: Worker's Compensation | Attending: Emergency Medicine | Admitting: Emergency Medicine

## 2014-03-22 ENCOUNTER — Emergency Department (HOSPITAL_COMMUNITY): Payer: Worker's Compensation

## 2014-03-22 DIAGNOSIS — S335XXA Sprain of ligaments of lumbar spine, initial encounter: Secondary | ICD-10-CM | POA: Insufficient documentation

## 2014-03-22 DIAGNOSIS — IMO0002 Reserved for concepts with insufficient information to code with codable children: Secondary | ICD-10-CM | POA: Insufficient documentation

## 2014-03-22 DIAGNOSIS — Y9210 Unspecified residential institution as the place of occurrence of the external cause: Secondary | ICD-10-CM | POA: Insufficient documentation

## 2014-03-22 DIAGNOSIS — Z79899 Other long term (current) drug therapy: Secondary | ICD-10-CM | POA: Insufficient documentation

## 2014-03-22 DIAGNOSIS — M5416 Radiculopathy, lumbar region: Secondary | ICD-10-CM

## 2014-03-22 DIAGNOSIS — Y93F2 Activity, caregiving, lifting: Secondary | ICD-10-CM | POA: Insufficient documentation

## 2014-03-22 DIAGNOSIS — S39012A Strain of muscle, fascia and tendon of lower back, initial encounter: Secondary | ICD-10-CM

## 2014-03-22 DIAGNOSIS — X500XXA Overexertion from strenuous movement or load, initial encounter: Secondary | ICD-10-CM | POA: Insufficient documentation

## 2014-03-22 DIAGNOSIS — F17200 Nicotine dependence, unspecified, uncomplicated: Secondary | ICD-10-CM | POA: Insufficient documentation

## 2014-03-22 DIAGNOSIS — Z880 Allergy status to penicillin: Secondary | ICD-10-CM | POA: Insufficient documentation

## 2014-03-22 HISTORY — DX: Type 2 diabetes mellitus without complications: E11.9

## 2014-03-22 MED ORDER — HYDROCODONE-ACETAMINOPHEN 5-325 MG PO TABS
2.0000 | ORAL_TABLET | Freq: Once | ORAL | Status: AC
  Administered 2014-03-22: 2 via ORAL
  Filled 2014-03-22: qty 2

## 2014-03-22 MED ORDER — IBUPROFEN 400 MG PO TABS
600.0000 mg | ORAL_TABLET | Freq: Once | ORAL | Status: AC
  Administered 2014-03-22: 600 mg via ORAL
  Filled 2014-03-22: qty 2

## 2014-03-22 MED ORDER — HYDROCODONE-ACETAMINOPHEN 5-325 MG PO TABS: 1.0000 | ORAL_TABLET | Freq: Four times a day (QID) | ORAL | Status: AC | PRN

## 2014-03-22 MED ORDER — DIAZEPAM 5 MG PO TABS
5.0000 mg | ORAL_TABLET | Freq: Once | ORAL | Status: AC
  Administered 2014-03-22: 5 mg via ORAL
  Filled 2014-03-22: qty 1

## 2014-03-22 MED ORDER — METHOCARBAMOL 500 MG PO TABS: 500.0000 mg | ORAL_TABLET | Freq: Two times a day (BID) | ORAL | Status: AC

## 2014-03-22 MED ORDER — HYDROMORPHONE HCL PF 2 MG/ML IJ SOLN
2.0000 mg | Freq: Once | INTRAMUSCULAR | Status: AC
  Administered 2014-03-22: 2 mg via INTRAMUSCULAR
  Filled 2014-03-22: qty 1

## 2014-03-22 MED ORDER — IBUPROFEN 600 MG PO TABS: 600.0000 mg | ORAL_TABLET | Freq: Four times a day (QID) | ORAL | Status: AC | PRN

## 2014-03-22 NOTE — Discharge Instructions (Signed)
We saw you in the ER for back pain. The imaging in the exam is normal, and our exam don't indicate any spinal cord involvement - and thus we feel comfortable sending you home. Please take the ibuprofen every 6 hours for the next 2 days, take the muscle relaxant as needed, and see your primary care doctor for further pain control.  Please use the back exercises to strengthen the back muscles.   Back Exercises Back exercises help treat and prevent back injuries. The goal of back exercises is to increase the strength of your abdominal and back muscles and the flexibility of your back. These exercises should be started when you no longer have back pain. Back exercises include:  Pelvic Tilt. Lie on your back with your knees bent. Tilt your pelvis until the lower part of your back is against the floor. Hold this position 5 to 10 sec and repeat 5 to 10 times.  Knee to Chest. Pull first 1 knee up against your chest and hold for 20 to 30 seconds, repeat this with the other knee, and then both knees. This may be done with the other leg straight or bent, whichever feels better.  Sit-Ups or Curl-Ups. Bend your knees 90 degrees. Start with tilting your pelvis, and do a partial, slow sit-up, lifting your trunk only 30 to 45 degrees off the floor. Take at least 2 to 3 seconds for each sit-up. Do not do sit-ups with your knees out straight. If partial sit-ups are difficult, simply do the above but with only tightening your abdominal muscles and holding it as directed.  Hip-Lift. Lie on your back with your knees flexed 90 degrees. Push down with your feet and shoulders as you raise your hips a couple inches off the floor; hold for 10 seconds, repeat 5 to 10 times.  Back arches. Lie on your stomach, propping yourself up on bent elbows. Slowly press on your hands, causing an arch in your low back. Repeat 3 to 5 times. Any initial stiffness and discomfort should lessen with repetition over time.  Shoulder-Lifts. Lie  face down with arms beside your body. Keep hips and torso pressed to floor as you slowly lift your head and shoulders off the floor. Do not overdo your exercises, especially in the beginning. Exercises may cause you some mild back discomfort which lasts for a few minutes; however, if the pain is more severe, or lasts for more than 15 minutes, do not continue exercises until you see your caregiver. Improvement with exercise therapy for back problems is slow.  See your caregivers for assistance with developing a proper back exercise program. Document Released: 11/19/2004 Document Revised: 01/04/2012 Document Reviewed: 08/13/2011 [facility] Patient Information 2014 [place], [place].  Back Pain, Adult Low back pain is very common. About 1 in 5 people have back pain.The cause of low back pain is rarely dangerous. The pain often gets better over time.About half of people with a sudden onset of back pain feel better in just 2 weeks. About 8 in 10 people feel better by 6 weeks.  CAUSES Some common causes of back pain include:  Strain of the muscles or ligaments supporting the spine.  Wear and tear (degeneration) of the spinal discs.  Arthritis.  Direct injury to the back. DIAGNOSIS Most of the time, the direct cause of low back pain is not known.However, back pain can be treated effectively even when the exact cause of the pain is unknown.Answering your caregiver's questions about your overall health and  symptoms is one of the most accurate ways to make sure the cause of your pain is not dangerous. If your caregiver needs more information, he or she may order lab work or imaging tests (X-rays or MRIs).However, even if imaging tests show changes in your back, this usually does not require surgery. HOME CARE INSTRUCTIONS For many people, back pain returns.Since low back pain is rarely dangerous, it is often a condition that people can learn to [facility] Cent[facility]ageon their own.   Remain active. It is stressful on  the back to sit or stand in one place. Do not sit, drive, or stand in one place for more than 30 minutes at a time. Take short walks on level surfaces as soon as pain allows.Try to increase the length of time you walk each day.  Do not stay in bed.Resting more than 1 or 2 days can delay your recovery.  Do not avoid exercise or work.Your body is made to move.It is not dangerous to be active, even though your back may hurt.Your back will likely heal faster if you return to being active before your pain is gone.  Pay attention to your body when you bend and lift. Many people have less discomfortwhen lifting if they bend their knees, keep the load close to their bodies,and avoid twisting. Often, the most comfortable positions are those that put less stress on your recovering back.  Find a comfortable position to sleep. Use a firm mattress and lie on your side with your knees slightly bent. If you lie on your back, put a pillow under your knees.  Only take over-the-counter or prescription medicines as directed by your caregiver. Over-the-counter medicines to reduce pain and inflammation are often the most helpful.Your caregiver may prescribe muscle relaxant drugs.These medicines help dull your pain so you can more quickly return to your normal activities and healthy exercise.  Put ice on the injured area.  Put ice in a plastic bag.  Place a towel between your skin and the bag.  Leave the ice on for 15-20 minutes, 03-04 times a day for the first 2 to 3 days. After that, ice and heat may be alternated to reduce pain and spasms.  Ask your caregiver about trying back exercises and gentle massage. This may be of some benefit.  Avoid feeling anxious or stressed.Stress increases muscle tension and can worsen back pain.It is important to recognize when you are anxious or stressed and learn ways to manage it.Exercise is a great option. SEEK MEDICAL CARE IF:  You have pain that is not relieved  with rest or medicine.  You have pain that does not improve in 1 week.  You have new symptoms.  You are generally not feeling well. SEEK IMMEDIATE MEDICAL CARE IF:   You have pain that radiates from your back into your legs.  You develop new bowel or bladder control problems.  You have unusual weakness or numbness in your arms or legs.  You develop nausea or vomiting.  You develop abdominal pain.  You feel faint. Document Released: 10/12/2005 Document Revised: 04/12/2012 Document Reviewed: 03/02/2011 [facility] Patient Information 2014 [place], [place].  Lumbosacral Strain Lumbosacral strain is a strain of any of the parts that make up your lumbosacral vertebrae. Your lumbosacral vertebrae are the bones that make up the lower third of your backbone. Your lumbosacral vertebrae are held together by muscles and tough, fibrous tissue (ligaments).  CAUSES  A sudden blow to your back can cause lumbosacral strain. Also, anything that causes  an excessive stretch of the muscles in the low back can cause this strain. This is typically seen when people exert themselves strenuously, fall, lift heavy objects, bend, or crouch repeatedly. RISK FACTORS  Physically demanding work.  Participation in pushing or pulling sports or sports that require sudden twist of the back (tennis, golf, baseball).  Weight lifting.  Excessive lower back curvature.  Forward-tilted pelvis.  Weak back or abdominal muscles or both.  Tight hamstrings. SIGNS AND SYMPTOMS  Lumbosacral strain may cause pain in the area of your injury or pain that moves (radiates) down your leg.  DIAGNOSIS Your health care provider can often diagnose lumbosacral strain through a physical exam. In some cases, you may need tests such as X-ray exams.  TREATMENT  Treatment for your lower back injury depends on many factors that your clinician will have to evaluate. However, most treatment will include the use of anti-inflammatory  medicines. HOME CARE INSTRUCTIONS   Avoid hard physical activities (tennis, racquetball, waterskiing) if you are not in proper physical condition for it. This may aggravate or create problems.  If you have a back problem, avoid sports requiring sudden body movements. Swimming and walking are generally safer activities.  Maintain good posture.  Maintain a healthy weight.  For acute conditions, you may put ice on the injured area.  Put ice in a plastic bag.  Place a towel between your skin and the bag.  Leave the ice on for 20 minutes, 2 3 times a day.  When the low back starts healing, stretching and strengthening exercises may be recommended. SEEK MEDICAL CARE IF:  Your back pain is getting worse.  You experience severe back pain not relieved with medicines. SEEK IMMEDIATE MEDICAL CARE IF:   You have numbness, tingling, weakness, or problems with the use of your arms or legs.  There is a change in bowel or bladder control.  You have increasing pain in any area of the body, including your belly (abdomen).  You notice shortness of breath, dizziness, or feel faint.  You feel sick to your stomach (nauseous), are throwing up (vomiting), or become sweaty.  You notice discoloration of your toes or legs, or your feet get very cold. MAKE SURE YOU:   Understand these instructions.  Will watch your condition.  Will get help right away if you are not doing well or get worse. Document Released: 07/22/2005 Document Revised: 08/02/2013 Document Reviewed: 05/31/2013 [facility] Patient Information 2014 [place], [place].

## 2014-03-22 NOTE — ED Provider Notes (Signed)
CSN: [id number]     Arrival date & time 03/22/14  0459 History   First MD Initiated Contact with Patient 03/22/14 0536     Chief Complaint  Patient presents with  . Back Injury     (Consider location/radiation/quality/duration/timing/severity/associated sxs/prior Treatment) HPI Comments:  Michaela DroughtLatisha G Sanders is a 28 y.o. female who complains of an injury causing low back pain prior to arrival. The pain is positional with extention of the back or lifting, with radiation down the left buttock.. 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 Symptoms have been acute since that time. Prior history of back problems: no prior back problems. There is no associated new numbness, weakness, urinary incontinence, urinary retention, bowel incontinence, saddle anesthesia.   The history is provided by the patient.    Past Medical History  Diagnosis Date  . Diabetes mellitus without complication    Past Surgical History  Procedure Laterality Date  . Cesarean section     No family history on file. History  Substance Use Topics  . Smoking status: Current Every Day Smoker  . Smokeless tobacco: Not on file  . Alcohol Use: No   OB History   Grav Para Term Preterm Abortions TAB SAB Ect Mult Living                 Review of Systems  Constitutional: Positive for activity change.  Respiratory: Negative for shortness of breath.   Cardiovascular: Negative for chest pain.  Gastrointestinal: Negative for nausea, vomiting and abdominal pain.  Genitourinary: Negative for dysuria.  Musculoskeletal: Positive for back pain. Negative for neck pain.  Neurological: Negative for numbness and headaches.  All other systems reviewed and are negative.     Allergies  Penicillins  Home Medications   Prior to Admission medications   Medication Sig Start Date End Date Taking? Authorizing Provider  gabapentin (NEURONTIN) 100 MG capsule Take 200 mg by mouth 3 (three) times daily.   Yes  Historical Provider, MD  ibuprofen (ADVIL,MOTRIN) 600 MG tablet Take 600 mg by mouth every 6 (six) hours as needed.   Yes Historical Provider, MD  metFORMIN (GLUCOPHAGE) 500 MG tablet Take by mouth 2 (two) times daily with a meal.   Yes Historical Provider, MD  HYDROcodone-acetaminophen (NORCO/VICODIN) 5-325 MG per tablet Take 1 tablet by mouth every 6 (six) hours as needed. 03/22/14   Derwood KaplanAnkit Deklen Popelka, MD  ibuprofen (ADVIL,MOTRIN) 600 MG tablet Take 1 tablet (600 mg total) by mouth every 6 (six) hours as needed. 03/22/14   Derwood KaplanAnkit Hammad Finkler, MD  methocarbamol (ROBAXIN) 500 MG tablet Take 1 tablet (500 mg total) by mouth 2 (two) times daily. 03/22/14   Dequon Schnebly Rhunette CroftNanavati, MD   BP 118/79  Pulse 80  Temp(Src) 97.7 F (36.5 C) (Oral)  Resp 16  SpO2 98%  LMP 03/22/2014 Physical Exam  Nursing note and vitals reviewed. Constitutional: She is oriented to person, place, and time. She appears well-developed and well-nourished.  HENT:  Head: Normocephalic and atraumatic.  Eyes: EOM are normal. Pupils are equal, round, and reactive to light.  Neck: Neck supple.  Cardiovascular: Normal rate, regular rhythm and normal heart sounds.   No murmur heard. Pulmonary/Chest: Effort normal. No respiratory distress.  Abdominal: Soft. She exhibits no distension. There is no tenderness. There is no rebound and no guarding.  Musculoskeletal:  Pt has tenderness over the lumbar region No step offs, no erythema. Pt has 1+ patellar reflex bilaterally. Able to discriminate between sharp and dull.  Neurological: She is alert and oriented to person, place, and time.  Skin: Skin is warm and dry.    ED Course  Procedures (including critical care time) Labs Review Labs Reviewed - No data to display  Imaging Review Dg Lumbar Spine Complete  03/22/2014   CLINICAL DATA:  Sudden onset of lower back pain while lifting patient.  EXAM: LUMBAR SPINE - COMPLETE 4+ VIEW  COMPARISON:  CT of the abdomen and pelvis performed  07/14/2008  FINDINGS: There is no evidence of fracture or subluxation. Vertebral bodies demonstrate normal height and alignment. Intervertebral disc spaces are preserved. The visualized neural foramina are grossly unremarkable in appearance.  The visualized bowel gas pattern is unremarkable in appearance; air and stool are noted within the colon. The sacroiliac joints are within normal limits.  IMPRESSION: No evidence of fracture or subluxation along the lumbar spine.   Electronically Signed   By: Roanna Raider M.D.   On: 03/22/2014 06:58     EKG Interpretation None      MDM   Final diagnoses:  Lumbar radicular pain  Lumbar spine strain    DDx includes: - DJD of the back - Spondylitises/ spondylosis - Sciatica - Spinal cord compression - Conus medullaris - Epidural hematoma - Epidural abscess - Lytic/pathologic fracture - Myelitis - Musculoskeletal pain  Pt comes in with acute back pain that started tonight. Sx were provoked by moving a patient while at work. She has some radiation of pain to the buttock. Neuro and back exam are reassuring. Xray is not showing any dislocation, fracture. Will treat as Sciatica/radicular lumbar sprain.  Derwood Kaplan, MD 03/22/14 [phone]

## 2014-03-22 NOTE — ED Notes (Signed)
Pt is employee of [employer], states she was helping lift a patient and felt a sudden pain in her lower back.

## 2014-06-10 ENCOUNTER — Emergency Department (HOSPITAL_COMMUNITY): Payer: Worker's Compensation

## 2014-06-10 ENCOUNTER — Emergency Department (HOSPITAL_COMMUNITY)
Admission: EM | Admit: 2014-06-10 | Discharge: 2014-06-10 | Disposition: A | Discharge status: Home / Self Care | Payer: Worker's Compensation | Attending: Emergency Medicine | Admitting: Emergency Medicine

## 2014-06-10 ENCOUNTER — Encounter (HOSPITAL_COMMUNITY): Payer: Self-pay | Admitting: Emergency Medicine

## 2014-06-10 DIAGNOSIS — S8990XA Unspecified injury of unspecified lower leg, initial encounter: Secondary | ICD-10-CM | POA: Insufficient documentation

## 2014-06-10 DIAGNOSIS — E119 Type 2 diabetes mellitus without complications: Secondary | ICD-10-CM | POA: Insufficient documentation

## 2014-06-10 DIAGNOSIS — S8000XA Contusion of unspecified knee, initial encounter: Secondary | ICD-10-CM | POA: Diagnosis not present

## 2014-06-10 DIAGNOSIS — Z880 Allergy status to penicillin: Secondary | ICD-10-CM | POA: Insufficient documentation

## 2014-06-10 DIAGNOSIS — S99929A Unspecified injury of unspecified foot, initial encounter: Secondary | ICD-10-CM | POA: Diagnosis present

## 2014-06-10 DIAGNOSIS — Z79899 Other long term (current) drug therapy: Secondary | ICD-10-CM | POA: Insufficient documentation

## 2014-06-10 DIAGNOSIS — F17200 Nicotine dependence, unspecified, uncomplicated: Secondary | ICD-10-CM | POA: Insufficient documentation

## 2014-06-10 DIAGNOSIS — Y9389 Activity, other specified: Secondary | ICD-10-CM | POA: Insufficient documentation

## 2014-06-10 DIAGNOSIS — S8001XA Contusion of right knee, initial encounter: Secondary | ICD-10-CM

## 2014-06-10 DIAGNOSIS — Y929 Unspecified place or not applicable: Secondary | ICD-10-CM | POA: Diagnosis not present

## 2014-06-10 DIAGNOSIS — S99919A Unspecified injury of unspecified ankle, initial encounter: Secondary | ICD-10-CM

## 2014-06-10 DIAGNOSIS — W010XXA Fall on same level from slipping, tripping and stumbling without subsequent striking against object, initial encounter: Secondary | ICD-10-CM | POA: Diagnosis not present

## 2014-06-10 MED ORDER — NAPROXEN 500 MG PO TABS: 500.0000 mg | ORAL_TABLET | Freq: Two times a day (BID) | ORAL | Status: AC

## 2014-06-10 MED ORDER — TRAMADOL HCL 50 MG PO TABS: 50.0000 mg | ORAL_TABLET | Freq: Four times a day (QID) | ORAL | Status: AC | PRN

## 2014-06-10 NOTE — ED Notes (Signed)
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  Patient c/o increased pain to right knee.

## 2014-06-10 NOTE — Discharge Instructions (Signed)
Fall Prevention and Home Safety Falls cause injuries and can affect all age groups. It is possible to use preventive measures to significantly decrease the likelihood of falls. There are many simple measures which can make your home safer and prevent falls. OUTDOORS  Repair cracks and edges of walkways and driveways.  Remove high doorway thresholds.  Trim shrubbery on the main path into your home.  Have good outside lighting.  Clear walkways of tools, rocks, debris, and clutter.  Check that handrails are not broken and are securely fastened. Both sides of steps should have handrails.  Have leaves, snow, and ice cleared regularly.  Use sand or salt on walkways during winter months.  In the garage, clean up grease or oil spills. BATHROOM  Install night lights.  Install grab bars by the toilet and in the tub and shower.  Use non-skid mats or decals in the tub or shower.  Place a plastic non-slip stool in the shower to sit on, if needed.  Keep floors dry and clean up all water on the floor immediately.  Remove soap buildup in the tub or shower on a regular basis.  Secure bath mats with non-slip, double-sided rug tape.  Remove throw rugs and tripping hazards from the floors. BEDROOMS  Install night lights.  Make sure a bedside light is easy to reach.  Do not use oversized bedding.  Keep a telephone by your bedside.  Have a firm chair with side arms to use for getting dressed.  Remove throw rugs and tripping hazards from the floor. KITCHEN  Keep handles on pots and pans turned toward the center of the stove. Use back burners when possible.  Clean up spills quickly and allow time for drying.  Avoid walking on wet floors.  Avoid hot utensils and knives.  Position shelves so they are not too high or low.  Place commonly used objects within easy reach.  If necessary, use a sturdy step stool with a grab bar when reaching.  Keep electrical cables out of the  way.  Do not use floor polish or wax that makes floors slippery. If you must use wax, use non-skid floor wax.  Remove throw rugs and tripping hazards from the floor. STAIRWAYS  Never leave objects on stairs.  Place handrails on both sides of stairways and use them. Fix any loose handrails. Make sure handrails on both sides of the stairways are as long as the stairs.  Check carpeting to make sure it is firmly attached along stairs. Make repairs to worn or loose carpet promptly.  Avoid placing throw rugs at the top or bottom of stairways, or properly secure the rug with carpet tape to prevent slippage. Get rid of throw rugs, if possible.  Have an electrician put in a light switch at the top and bottom of the stairs. OTHER FALL PREVENTION TIPS  Wear low-heel or rubber-soled shoes that are supportive and fit well. Wear closed toe shoes.  When using a stepladder, make sure it is fully opened and both spreaders are firmly locked. Do not climb a closed stepladder.  Add color or contrast paint or tape to grab bars and handrails in your home. Place contrasting color strips on first and last steps.  Learn and use mobility aids as needed. Install an electrical emergency response system.  Turn on lights to avoid dark areas. Replace light bulbs that burn out immediately. Get light switches that glow.  Arrange furniture to create clear pathways. Keep furniture in the same place.  Firmly attach carpet with non-skid or double-sided tape.  Eliminate uneven floor surfaces.  Select a carpet pattern that does not visually hide the edge of steps.  Be aware of all pets. OTHER HOME SAFETY TIPS  Set the water temperature for 120 F (48.8 C).  Keep emergency numbers on or near the telephone.  Keep smoke detectors on every level of the home and near sleeping areas. Document Released: 10/02/2002 Document Revised: 04/12/2012 Document Reviewed: 01/01/2012 [facility] Patient Information 2015  [place], [place]. This information is not intended to replace advice given to you by your health care provider. Make sure you discuss any questions you have with your health care provider.   Contusion A contusion is a deep bruise. Contusions are the result of an injury that caused bleeding under the skin. The contusion may turn blue, purple, or yellow. Minor injuries will give you a painless contusion, but more severe contusions may stay painful and swollen for a few weeks.  CAUSES  A contusion is usually caused by a blow, trauma, or direct force to an area of the body. SYMPTOMS   Swelling and redness of the injured area.  Bruising of the injured area.  Tenderness and soreness of the injured area.  Pain. DIAGNOSIS  The diagnosis can be made by taking a history and physical exam. An X-ray, CT scan, or MRI may be needed to determine if there were any associated injuries, such as fractures. TREATMENT  Specific treatment will depend on what area of the body was injured. In general, the best treatment for a contusion is resting, icing, elevating, and applying cold compresses to the injured area. Over-the-counter medicines may also be recommended for pain control. Ask your caregiver what the best treatment is for your contusion. HOME CARE INSTRUCTIONS   Put ice on the injured area.  Put ice in a plastic bag.  Place a towel between your skin and the bag.  Leave the ice on for 15-20 minutes, 3-4 times a day, or as directed by your health care provider.  Only take over-the-counter or prescription medicines for pain, discomfort, or fever as directed by your caregiver. Your caregiver may recommend avoiding anti-inflammatory medicines (aspirin, ibuprofen, and naproxen) for 48 hours because these medicines may increase bruising.  Rest the injured area.  If possible, elevate the injured area to reduce swelling. SEEK IMMEDIATE MEDICAL CARE IF:   You have increased bruising or swelling.  You have  pain that is getting worse.  Your swelling or pain is not relieved with medicines. MAKE SURE YOU:   Understand these instructions.  Will watch your condition.  Will get help right away if you are not doing well or get worse. Document Released: 07/22/2005 Document Revised: 10/17/2013 Document Reviewed: 08/17/2011 [facility] Patient Information 2015 [place], [place]. This information is not intended to replace advice given to you by your health care provider. Make sure you discuss any questions you have with your health care provider.  Naproxen and naproxen sodium oral immediate-release tablets What is this medicine? NAPROXEN (na PROX en) is a non-steroidal anti-inflammatory drug (NSAID). It is used to reduce swelling and to treat pain. This medicine may be used for dental pain, headache, or painful monthly periods. It is also used for painful joint and muscular problems such as arthritis, tendinitis, bursitis, and gout. This medicine may be used for other purposes; ask your health care provider or pharmacist if you have questions. COMMON BRAND NAME(S): Aflaxen, Aleve, Aleve Arthritis, All Day Relief, Anaprox, Anaprox DS, Naprosyn  What should I tell my health care provider before I take this medicine? They need to know if you have any of these conditions: -asthma -cigarette smoker -drink more than 3 alcohol containing drinks a day -heart disease or circulation problems such as heart failure or leg edema (fluid retention) -high blood pressure -kidney disease -liver disease -stomach bleeding or ulcers -an unusual or allergic reaction to naproxen, aspirin, other NSAIDs, other medicines, foods, dyes, or preservatives -pregnant or trying to get pregnant -breast-feeding How should I use this medicine? Take this medicine by mouth with a glass of water. Follow the directions on the prescription label. Take it with food if your stomach gets upset. Try to not lie down for at least 10 minutes after  you take it. Take your medicine at regular intervals. Do not take your medicine more often than directed. Long-term, continuous use may increase the risk of heart attack or stroke. A special MedGuide will be given to you by the pharmacist with each prescription and refill. Be sure to read this information carefully each time. Talk to your pediatrician regarding the use of this medicine in children. Special care may be needed. Overdosage: If you think you have taken too much of this medicine contact a poison control center or emergency room at once. NOTE: This medicine is only for you. Do not share this medicine with others. What if I miss a dose? If you miss a dose, take it as soon as you can. If it is almost time for your next dose, take only that dose. Do not take double or extra doses. What may interact with this medicine? -alcohol -aspirin -cidofovir -diuretics -lithium -methotrexate -other drugs for inflammation like ketorolac or prednisone -pemetrexed -probenecid -warfarin This list may not describe all possible interactions. Give your health care provider a list of all the medicines, herbs, non-prescription drugs, or dietary supplements you use. Also tell them if you smoke, drink alcohol, or use illegal drugs. Some items may interact with your medicine. What should I watch for while using this medicine? Tell your doctor or health care professional if your pain does not get better. Talk to your doctor before taking another medicine for pain. Do not treat yourself. This medicine does not prevent heart attack or stroke. In fact, this medicine may increase the chance of a heart attack or stroke. The chance may increase with longer use of this medicine and in people who have heart disease. If you take aspirin to prevent heart attack or stroke, talk with your doctor or health care professional. Do not take other medicines that contain aspirin, ibuprofen, or naproxen with this medicine. Side  effects such as stomach upset, nausea, or ulcers may be more likely to occur. Many medicines available without a prescription should not be taken with this medicine. This medicine can cause ulcers and bleeding in the stomach and intestines at any time during treatment. Do not smoke cigarettes or drink alcohol. These increase irritation to your stomach and can make it more susceptible to damage from this medicine. Ulcers and bleeding can happen without warning symptoms and can cause death. You may get drowsy or dizzy. Do not drive, use machinery, or do anything that needs mental alertness until you know how this medicine affects you. Do not stand or sit up quickly, especially if you are an older patient. This reduces the risk of dizzy or fainting spells. This medicine can cause you to bleed more easily. Try to avoid damage to your teeth and  gums when you brush or floss your teeth. What side effects may I notice from receiving this medicine? Side effects that you should report to your doctor or health care professional as soon as possible: -black or bloody stools, blood in the urine or vomit -blurred vision -chest pain -difficulty breathing or wheezing -nausea or vomiting -severe stomach pain -skin rash, skin redness, blistering or peeling skin, hives, or itching -slurred speech or weakness on one side of the body -swelling of eyelids, throat, lips -unexplained weight gain or swelling -unusually weak or tired -yellowing of eyes or skin Side effects that usually do not require medical attention (report to your doctor or health care professional if they continue or are bothersome): -constipation -headache -heartburn This list may not describe all possible side effects. Call your doctor for medical advice about side effects. You may report side effects to FDA at 1-800-FDA-1088. Where should I keep my medicine? Keep out of the reach of children. Store at room temperature between 15 and 30 degrees  C (59 and 86 degrees F). Keep container tightly closed. Throw away any unused medicine after the expiration date. NOTE: This sheet is a summary. It may not cover all possible information. If you have questions about this medicine, talk to your doctor, pharmacist, or health care provider.  2015, [employer] Standard. (2009-10-14 20:10:16)  Tramadol tablets What is this medicine? TRAMADOL (TRA ma dole) is a pain reliever. It is used to treat moderate to severe pain in adults. This medicine may be used for other purposes; ask your health care provider or pharmacist if you have questions. COMMON BRAND NAME(S): Ultram What should I tell my health care provider before I take this medicine? They need to know if you have any of these conditions: -brain tumor -depression -drug abuse or addiction -head injury -if you frequently drink alcohol containing drinks -kidney disease or trouble passing urine -liver disease -lung disease, asthma, or breathing problems -seizures or epilepsy -suicidal thoughts, plans, or attempt; a previous suicide attempt by you or a family member -an unusual or allergic reaction to tramadol, codeine, other medicines, foods, dyes, or preservatives -pregnant or trying to get pregnant -breast-feeding How should I use this medicine? Take this medicine by mouth with a full glass of water. Follow the directions on the prescription label. If the medicine upsets your stomach, take it with food or milk. Do not take more medicine than you are told to take. Talk to your pediatrician regarding the use of this medicine in children. Special care may be needed. Overdosage: If you think you have taken too much of this medicine contact a poison control center or emergency room at once. NOTE: This medicine is only for you. Do not share this medicine with others. What if I miss a dose? If you miss a dose, take it as soon as you can. If it is almost time for your next dose, take only that  dose. Do not take double or extra doses. What may interact with this medicine? Do not take this medicine with any of the following medications: -MAOIs like Carbex, Eldepryl, Marplan, Nardil, and Parnate This medicine may also interact with the following medications: -alcohol or medicines that contain alcohol -antihistamines -benzodiazepines -bupropion -carbamazepine or oxcarbazepine -clozapine -cyclobenzaprine -digoxin -furazolidone -linezolid -medicines for depression, anxiety, or psychotic disturbances -medicines for migraine headache like almotriptan, eletriptan, frovatriptan, naratriptan, rizatriptan, sumatriptan, zolmitriptan -medicines for pain like pentazocine, buprenorphine, butorphanol, meperidine, nalbuphine, and propoxyphene -medicines for sleep -muscle relaxants -naltrexone -phenobarbital -phenothiazines like  perphenazine, thioridazine, chlorpromazine, mesoridazine, fluphenazine, prochlorperazine, promazine, and trifluoperazine -procarbazine -warfarin This list may not describe all possible interactions. Give your health care provider a list of all the medicines, herbs, non-prescription drugs, or dietary supplements you use. Also tell them if you smoke, drink alcohol, or use illegal drugs. Some items may interact with your medicine. What should I watch for while using this medicine? Tell your doctor or health care professional if your pain does not go away, if it gets worse, or if you have new or a different type of pain. You may develop tolerance to the medicine. Tolerance means that you will need a higher dose of the medicine for pain relief. Tolerance is normal and is expected if you take this medicine for a long time. Do not suddenly stop taking your medicine because you may develop a severe reaction. Your body becomes used to the medicine. This does NOT mean you are addicted. Addiction is a behavior related to getting and using a drug for a non-medical reason. If you have  pain, you have a medical reason to take pain medicine. Your doctor will tell you how much medicine to take. If your doctor wants you to stop the medicine, the dose will be slowly lowered over time to avoid any side effects. You may get drowsy or dizzy. Do not drive, use machinery, or do anything that needs mental alertness until you know how this medicine affects you. Do not stand or sit up quickly, especially if you are an older patient. This reduces the risk of dizzy or fainting spells. Alcohol can increase or decrease the effects of this medicine. Avoid alcoholic drinks. You may have constipation. Try to have a bowel movement at least every 2 to 3 days. If you do not have a bowel movement for 3 days, call your doctor or health care professional. Your mouth may get dry. Chewing sugarless gum or sucking hard candy, and drinking plenty of water may help. Contact your doctor if the problem does not go away or is severe. What side effects may I notice from receiving this medicine? Side effects that you should report to your doctor or health care professional as soon as possible: -allergic reactions like skin rash, itching or hives, swelling of the face, lips, or tongue -breathing difficulties, wheezing -confusion -itching -light headedness or fainting spells -redness, blistering, peeling or loosening of the skin, including inside the mouth -seizures Side effects that usually do not require medical attention (report to your doctor or health care professional if they continue or are bothersome): -constipation -dizziness -drowsiness -headache -nausea, vomiting This list may not describe all possible side effects. Call your doctor for medical advice about side effects. You may report side effects to FDA at 1-800-FDA-1088. Where should I keep my medicine? Keep out of the reach of children. Store at room temperature between 15 and 30 degrees C (59 and 86 degrees F). Keep container tightly closed. Throw  away any unused medicine after the expiration date. NOTE: This sheet is a summary. It may not cover all possible information. If you have questions about this medicine, talk to your doctor, pharmacist, or health care provider.  2015, [employer]vier/[employer] Standard. (2010-06-25 11:55:44)

## 2014-06-10 NOTE — ED Provider Notes (Signed)
CSN: [id number]     Arrival date & time 06/10/14  0049 History   First MD Initiated Contact with Patient 06/10/14 0207     Chief Complaint  Patient presents with  . Knee Injury     (Consider location/radiation/quality/duration/timing/severity/associated sxs/prior Treatment) The history is provided by the patient.   28 year old female states she tripped over a chair where she is working and fell landing on her right knee. She is complaining of pain in that knee. Pain is worse with movement and with walking. She rates pain at 9/10. She denies other injury. She has not taken anything for pain. She specifically denies head, neck, back injury.  Past Medical History  Diagnosis Date  . Diabetes mellitus without complication    Past Surgical History  Procedure Laterality Date  . Cesarean section     No family history on file. History  Substance Use Topics  . Smoking status: Current Every Day Smoker  . Smokeless tobacco: Not on file  . Alcohol Use: No   OB History   Grav Para Term Preterm Abortions TAB SAB Ect Mult Living                 Review of Systems  All other systems reviewed and are negative.     Allergies  Penicillins  Home Medications   Prior to Admission medications   Medication Sig Start Date End Date Taking? Authorizing Provider  gabapentin (NEURONTIN) 100 MG capsule Take 200 mg by mouth 3 (three) times daily.   Yes Historical Provider, MD  ibuprofen (ADVIL,MOTRIN) 600 MG tablet Take 600 mg by mouth every 6 (six) hours as needed.   Yes Historical Provider, MD  ibuprofen (ADVIL,MOTRIN) 600 MG tablet Take 1 tablet (600 mg total) by mouth every 6 (six) hours as needed. 03/22/14  Yes Derwood KaplanAnkit Nanavati, MD  metFORMIN (GLUCOPHAGE) 500 MG tablet Take by mouth 2 (two) times daily with a meal.   Yes Historical Provider, MD  HYDROcodone-acetaminophen (NORCO/VICODIN) 5-325 MG per tablet Take 1 tablet by mouth every 6 (six) hours as needed. 03/22/14   Derwood KaplanAnkit Nanavati, MD   methocarbamol (ROBAXIN) 500 MG tablet Take 1 tablet (500 mg total) by mouth 2 (two) times daily. 03/22/14   Derwood KaplanAnkit Nanavati, MD  naproxen (NAPROSYN) 500 MG tablet Take 1 tablet (500 mg total) by mouth 2 (two) times daily. 06/10/14   Dione Boozeavid Corvette Orser, MD  traMADol (ULTRAM) 50 MG tablet Take 1 tablet (50 mg total) by mouth every 6 (six) hours as needed. 06/10/14   Dione Boozeavid Dodi Leu, MD   BP 126/74  Pulse 94  Temp(Src) 99.1 F (37.3 C) (Oral)  Resp 18  Ht 5\' 4"  (1.626 m)  Wt 285 lb (129.275 kg)  BMI 48.90 kg/m2  SpO2 100% Physical Exam  Nursing note and vitals reviewed.  28 year old female, resting comfortably and in no acute distress. Vital signs are normal. Oxygen saturation is 100%, which is normal. Head is normocephalic and atraumatic. PERRLA, EOMI. Oropharynx is clear. Neck is nontender and supple without adenopathy or JVD. Back is nontender and there is no CVA tenderness. Lungs are clear without rales, wheezes, or rhonchi. Chest is nontender. Heart has regular rate and rhythm without murmur. Abdomen is soft, flat, nontender without masses or hepatosplenomegaly and peristalsis is normoactive. Extremities have no cyanosis or edema, full range of motion is present. There is tenderness to palpation over the anterior aspect of the right knee but no significant swelling is present. There is no instability of the knee.  Lachman and McMurray's tests are negative. She is able to hold the knee straight against gravity. Skin is warm and dry without rash. Neurologic: Mental status is normal, cranial nerves are intact, there are no motor or sensory deficits.  ED Course  Procedures (including critical care time) Imaging Review Dg Knee Complete 4 Views Right  06/10/2014   CLINICAL DATA:  Fall, right knee pain.  EXAM: RIGHT KNEE - COMPLETE 4+ VIEW  COMPARISON:  None.  FINDINGS: There is no evidence of fracture, dislocation, or joint effusion. There is no evidence of arthropathy or other focal bone abnormality.  Soft tissues are unremarkable.  IMPRESSION: Negative.   Electronically Signed   By: Charlett Nose M.D.   On: 06/10/2014 01:32   MDM   Final diagnoses:  Fall from slip, trip, or stumble, initial encounter  Contusion of right knee, initial encounter    Fall with contusion of right knee. No evidence of fracture. She is placed in a knee immobilizer for comfort and given crutches to use as needed. Prescriptions are given for naproxen and tramadol and she is given work release for 24 hours. Follow up with orthopedics if not improving.    Dione Booze, MD 06/10/14 [phone]

## 2015-11-24 IMAGING — CR DG KNEE COMPLETE 4+V*R*
4 series · 4 of 4 positions shown · non-contrast
Comparison: None.

CLINICAL DATA: Fall, right knee pain.

EXAM:
RIGHT KNEE - COMPLETE 4+ VIEW

[view not recorded (1 of 4)]
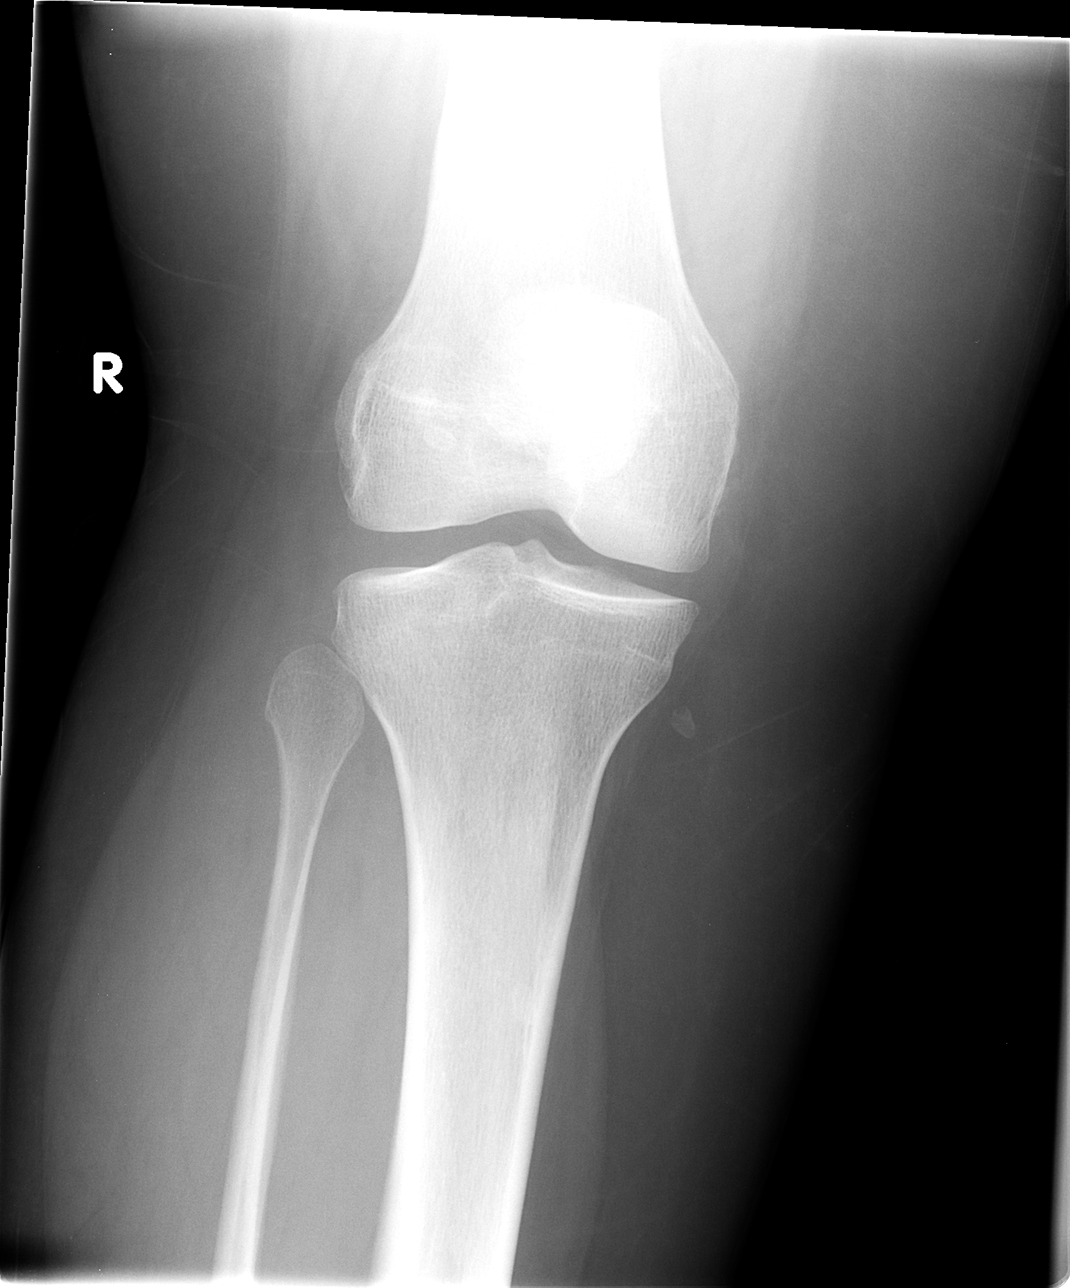

[view not recorded (2 of 4)]
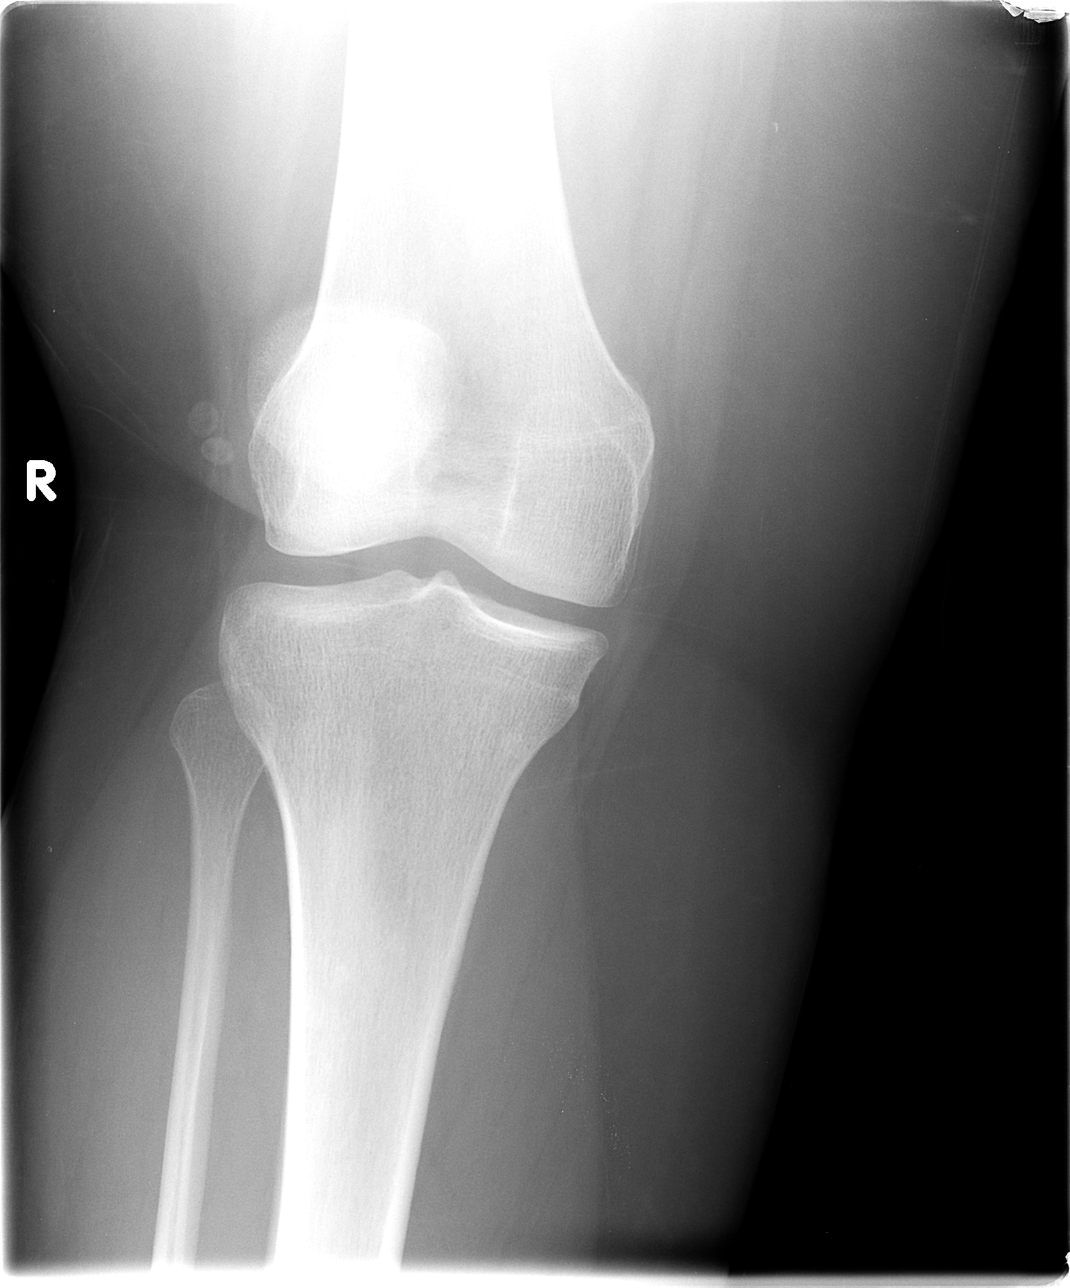

[view not recorded (3 of 4)]
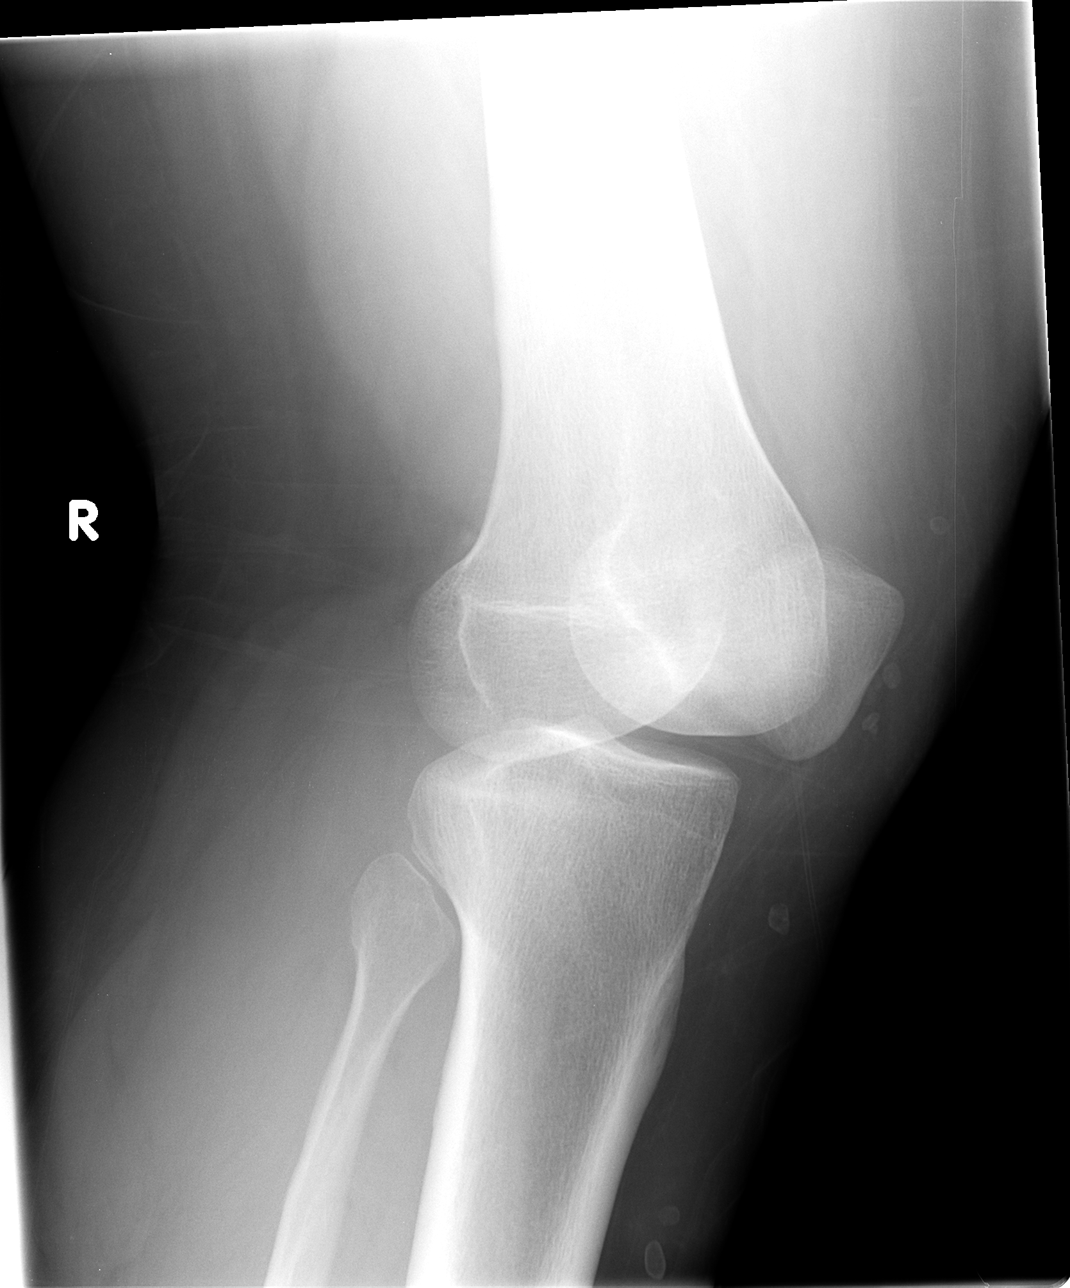

[view not recorded (4 of 4)]
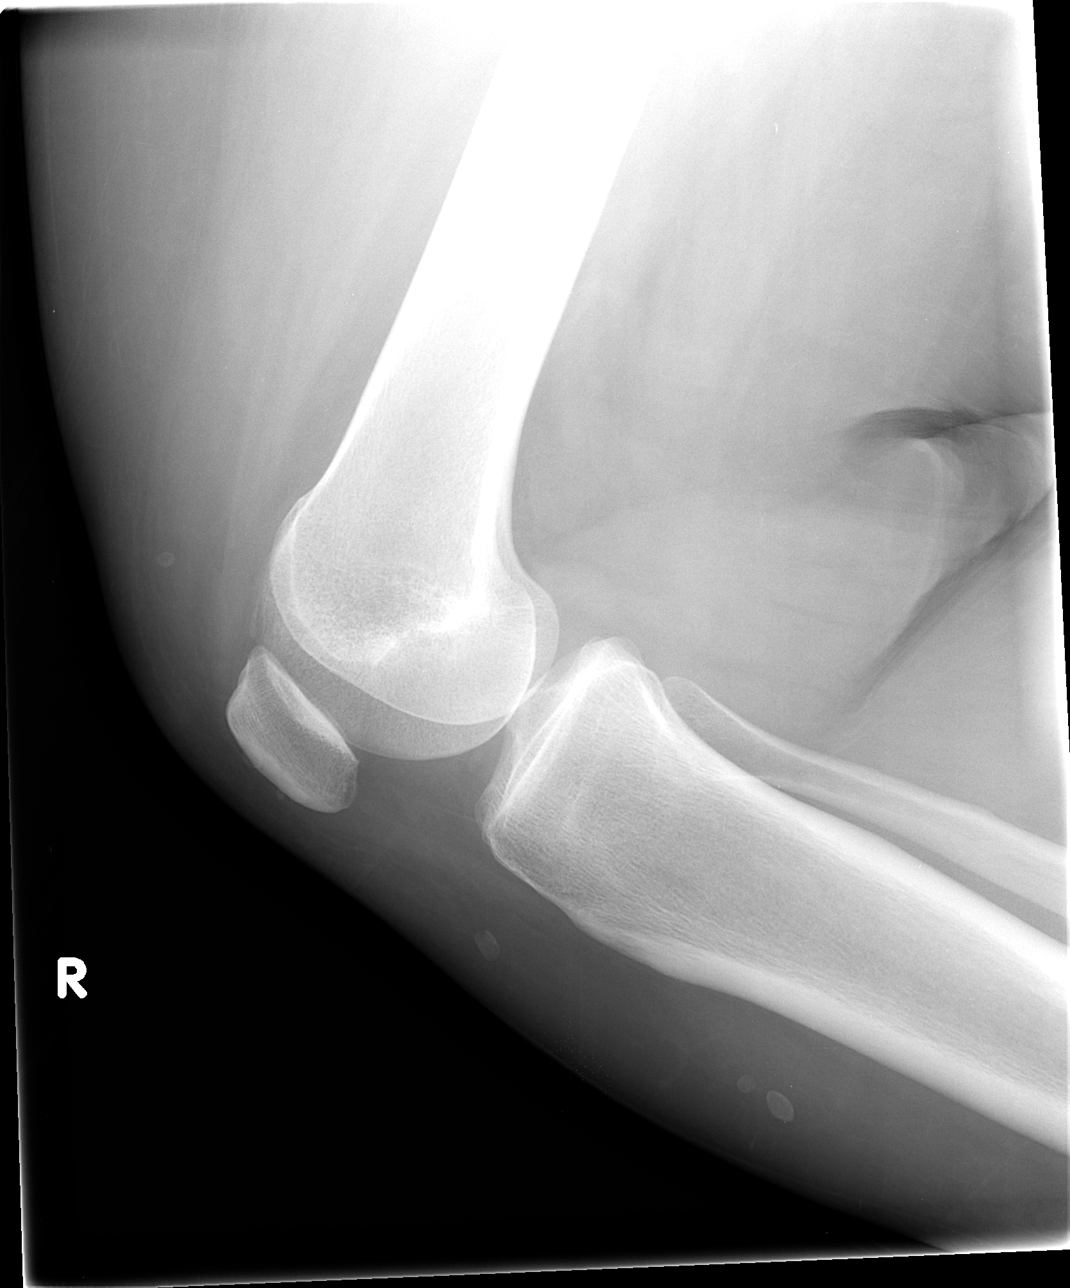

[4 of 4 positions shown; findings below may reference images not displayed]

FINDINGS: There is no evidence of fracture, dislocation, or joint effusion.
There is no evidence of arthropathy or other focal bone abnormality.
Soft tissues are unremarkable.
IMPRESSION: Negative.
